# Patient Record
Sex: Male | Born: 1965 | Race: White | Hispanic: No | Marital: Married | Smoking: Never smoker
Health system: Southern US, Community
[De-identification: ages and names within clinical notes are randomized; demographics above are authoritative.]

## PROBLEM LIST (undated history)

## (undated) DIAGNOSIS — I519 Heart disease, unspecified: Secondary | ICD-10-CM

## (undated) DIAGNOSIS — I10 Essential (primary) hypertension: Secondary | ICD-10-CM

## (undated) HISTORY — PX: KNEE ARTHROSCOPY: SUR90

## (undated) HISTORY — DX: Essential (primary) hypertension: I10

## (undated) HISTORY — DX: Heart disease, unspecified: I51.9

---

## 2006-10-31 ENCOUNTER — Emergency Department: Payer: Self-pay | Admitting: Emergency Medicine

## 2014-07-10 ENCOUNTER — Ambulatory Visit: Payer: Self-pay | Admitting: Pain Medicine

## 2014-09-02 ENCOUNTER — Ambulatory Visit: Payer: Self-pay | Admitting: Pain Medicine

## 2014-09-30 ENCOUNTER — Ambulatory Visit: Payer: Self-pay | Admitting: Pain Medicine

## 2014-10-02 ENCOUNTER — Ambulatory Visit: Payer: Self-pay | Admitting: Pain Medicine

## 2014-10-15 ENCOUNTER — Ambulatory Visit: Payer: Self-pay | Admitting: Pain Medicine

## 2014-11-11 ENCOUNTER — Ambulatory Visit: Payer: Self-pay | Admitting: Pain Medicine

## 2014-11-26 ENCOUNTER — Ambulatory Visit: Payer: Self-pay | Admitting: Pain Medicine

## 2014-11-29 ENCOUNTER — Ambulatory Visit: Payer: Self-pay | Admitting: Family Medicine

## 2014-12-02 ENCOUNTER — Ambulatory Visit: Payer: Self-pay | Admitting: Family Medicine

## 2015-01-15 ENCOUNTER — Ambulatory Visit
Admit: 2015-01-15 | Disposition: A | Discharge status: Home / Self Care | Payer: Self-pay | Attending: Pain Medicine | Admitting: Pain Medicine

## 2015-01-22 ENCOUNTER — Ambulatory Visit: Payer: Self-pay | Admitting: Pain Medicine

## 2015-01-28 ENCOUNTER — Encounter: Payer: Self-pay | Admitting: Pain Medicine

## 2015-01-28 ENCOUNTER — Ambulatory Visit: Payer: BLUE CROSS/BLUE SHIELD | Attending: Pain Medicine | Admitting: Pain Medicine

## 2015-01-28 VITALS — BP 108/53 | HR 63 | Temp 97.5°F | Resp 16 | Ht 73.0 in | Wt 340.0 lb

## 2015-01-28 DIAGNOSIS — M179 Osteoarthritis of knee, unspecified: Secondary | ICD-10-CM | POA: Insufficient documentation

## 2015-01-28 DIAGNOSIS — M5136 Other intervertebral disc degeneration, lumbar region: Secondary | ICD-10-CM | POA: Insufficient documentation

## 2015-01-28 DIAGNOSIS — M4186 Other forms of scoliosis, lumbar region: Secondary | ICD-10-CM | POA: Insufficient documentation

## 2015-01-28 DIAGNOSIS — M1710 Unilateral primary osteoarthritis, unspecified knee: Secondary | ICD-10-CM | POA: Insufficient documentation

## 2015-01-28 DIAGNOSIS — M51369 Other intervertebral disc degeneration, lumbar region without mention of lumbar back pain or lower extremity pain: Secondary | ICD-10-CM | POA: Insufficient documentation

## 2015-01-28 DIAGNOSIS — M47816 Spondylosis without myelopathy or radiculopathy, lumbar region: Secondary | ICD-10-CM | POA: Diagnosis not present

## 2015-01-28 DIAGNOSIS — M545 Low back pain: Secondary | ICD-10-CM | POA: Diagnosis present

## 2015-01-28 DIAGNOSIS — M170 Bilateral primary osteoarthritis of knee: Secondary | ICD-10-CM

## 2015-01-28 MED ORDER — ORPHENADRINE CITRATE 30 MG/ML IJ SOLN
INTRAMUSCULAR | Status: AC
  Administered 2015-01-28: 60 mg
  Filled 2015-01-28: qty 2

## 2015-01-28 MED ORDER — TIZANIDINE HCL 4 MG PO CAPS: ORAL_CAPSULE | ORAL | Status: DC

## 2015-01-28 MED ORDER — LIDOCAINE HCL (PF) 1 % IJ SOLN
INTRAMUSCULAR | Status: AC
  Administered 2015-01-28: 09:00:00
  Filled 2015-01-28: qty 5

## 2015-01-28 MED ORDER — SODIUM CHLORIDE 0.9 % IJ SOLN
INTRAMUSCULAR | Status: AC
  Administered 2015-01-28: 09:00:00
  Filled 2015-01-28: qty 20

## 2015-01-28 MED ORDER — MIDAZOLAM HCL 5 MG/5ML IJ SOLN
INTRAMUSCULAR | Status: AC
  Administered 2015-01-28: 4 mg
  Filled 2015-01-28: qty 5

## 2015-01-28 MED ORDER — BUPIVACAINE HCL (PF) 0.25 % IJ SOLN
INTRAMUSCULAR | Status: AC
  Administered 2015-01-28: 09:00:00
  Filled 2015-01-28: qty 30

## 2015-01-28 MED ORDER — CEFAZOLIN SODIUM 1 G IJ SOLR
INTRAMUSCULAR | Status: AC
Start: 2015-01-28 — End: 2015-01-28
  Administered 2015-01-28: 1 g
  Filled 2015-01-28: qty 10

## 2015-01-28 MED ORDER — FENTANYL CITRATE (PF) 100 MCG/2ML IJ SOLN
INTRAMUSCULAR | Status: AC
  Administered 2015-01-28: 100 ug
  Filled 2015-01-28: qty 2

## 2015-01-28 MED ORDER — TRIAMCINOLONE ACETONIDE 40 MG/ML IJ SUSP
INTRAMUSCULAR | Status: AC
Start: 2015-01-28 — End: 2015-01-28
  Administered 2015-01-28: 09:00:00
  Filled 2015-01-28: qty 1

## 2015-01-28 NOTE — Patient Instructions (Addendum)
Continue present medications and antibioticcs. Patient reminded of respiratory depression confusion of the side effects of Zanaflex. Patient's wife also informed of the depression confusion and side effects of Zanaflex. Both understanding and will make all attempts to avoid undesirable side effects  F/U PCP for evaliation of  BP and general medical  Condition.  F/U surgical evaluation.  F/U nrurological evaluation.  May consider radiofrequency rhizolysis or intraspinal procedures pending response to present treatment and F/U evaluation.  Patient to call [facility] should patient have concerns prior to scheduled return appointment.     Pain Management Discharge Instructions  General Discharge Instructions :  If you need to reach your doctor call: Monday-Friday 8:00 am - 4:00 pm at [phone] or toll free [phone].  After clinic hours [phone] to have operator reach doctor.  Bring all of your medication bottles to all your appointments in the pain clinic.  To cancel or reschedule your appointment with Pain Management please remember to call 24 hours in advance to avoid a fee.  Refer to the educational materials which you have been given on: General Risks, I had my Procedure. Discharge Instructions, Post Sedation.  Post Procedure Instructions:  The drugs you were given will stay in your system until tomorrow, so for the next 24 hours you should not drive, make any legal decisions or drink any alcoholic beverages.  You may eat anything you prefer, but it is better to start with liquids then soups and crackers, and gradually work up to solid foods.  Please notify your doctor immediately if you have any unusual bleeding, trouble breathing or pain that is not related to your normal pain.  Depending on the type of procedure that was done, some parts of your body may feel week and/or numb.  This usually clears up by tonight or the next day.  Walk with the use of an  assistive device or accompanied by an adult for the 24 hours.  You may use ice on the affected area for the first 24 hours.  Put ice in a Ziploc bag and cover with a towel and place against area 15 minutes on 15 minutes off.  You may switch to heat after 24 hours.

## 2015-01-28 NOTE — Progress Notes (Signed)
PROCEDURE PERFORMED: Lumbar epidural steroid injection   NOTE: The patient is a 49 y.o. male who returns to [facility] for further evaluation and treatment of pain involving the lumbar and lower extremity region. MRI revealed the patient to be with degenerative changes of the lumbar spine with loss of height of L2-3 and L3-4, moderate loss of height at L4-5, loss of disc height at L5-S1, facet degenerative changes noted throughout the lumbar spine, evidence of L4-5 and L5-S1 level scoliosis with apex at L2-3. There is concern regarding intraspinal abnormalities contributing to patient's symptoms. We will proceed with lumbar epidural steroid injection in attempt to decrease severity of symptoms and minimize severity of patient's pain and hopefully retard progression of patient's symptoms. The risks, benefits, and expectations of the procedure have been discussed and explained to the patient who was understanding and in agreement with suggested treatment plan. We will proceed with interventional treatment as discussed and explained to the patient who is willing to proceed with procedure as planned.   DESCRIPTION OF PROCEDURE: Lumbar epidural steroid injection with IV Versed, IV fentanyl conscious sedation, EKG, blood pressure, pulse, and pulse oximetry monitoring. The procedure was performed with the patient in the prone position under fluoroscopic guidance. A local anesthetic skin wheal of 1.5% plain lidocaine was accomplished at proposed entry site. An 18-gauge Tuohy epidural needle was inserted at the L 3 vertebral body level left of the midline via loss-of-resistance technique with negative heme and negative CSF return. A total of 4 mL of Preservative-Free normal saline with 40 mg of Kenalog injected incrementally via epidurally placed needle. Needle removed. The patient tolerated the injection well.   PLAN:   1. Medications: We will continue presently prescribed medications. 2. Will consider  modification of treatment regimen pending response to treatment rendered on today's visit and follow-up evaluation. 3. The patient is to follow-up with primary care physician regarding blood pressure and general medical condition status post lumbar epidural steroid injection performed on today's visit. 4. Surgical evaluation. 5. Neurological evaluation. 6. The patient may be a candidate for radiofrequency procedures, implantation device, and other treatment pending response to treatment and follow-up evaluation. 7. The patient has been advised to adhere to proper body mechanics and avoid activities which appear to aggravate condition. 8. The patient has been advised to call the Pain Management Center prior to scheduled return appointment should there be significant change in condition or should there be significant  1. Medications: We will continue presently prescribed medications.  2. Will consider modification of treatment regimen pending response to treatment rendered on today's visit and follow-up evaluation.  3. The patient is to follow-up with primary care physician regarding blood pressure and general medical condition status post lumbar epidural steroid injection performed on today's visit.  4. Surgical evaluation.  5. Neurological evaluation. 6. The patient may be a candidate for radiofrequency procedures, implantation device, and other treatment pending response to treatment and follow-up evaluation.  7. The patient has been advised to adhere to proper body mechanics and avoid activities which appear to aggravate condition.  8. The patient has been advised to call the Pain Management Center prior to scheduled return appointment should there be significant change in condition or should should patient have other concerns regarding condition prior to scheduled return appointment.  The patient is understanding and in agreement with suggested treatment plan.

## 2015-01-29 ENCOUNTER — Telehealth: Payer: Self-pay | Admitting: *Deleted

## 2015-01-29 NOTE — Telephone Encounter (Signed)
Follow up call complete

## 2015-02-02 NOTE — Telephone Encounter (Signed)
No problems post procedure. 

## 2015-02-12 ENCOUNTER — Ambulatory Visit: Payer: BLUE CROSS/BLUE SHIELD | Attending: Pain Medicine | Admitting: Pain Medicine

## 2015-02-12 ENCOUNTER — Encounter: Payer: Self-pay | Admitting: Pain Medicine

## 2015-02-12 VITALS — BP 115/50 | HR 70 | Temp 97.6°F | Resp 16 | Ht 73.0 in | Wt 350.0 lb

## 2015-02-12 DIAGNOSIS — M533 Sacrococcygeal disorders, not elsewhere classified: Secondary | ICD-10-CM | POA: Diagnosis not present

## 2015-02-12 DIAGNOSIS — M79605 Pain in left leg: Secondary | ICD-10-CM | POA: Diagnosis present

## 2015-02-12 DIAGNOSIS — M545 Low back pain: Secondary | ICD-10-CM | POA: Diagnosis present

## 2015-02-12 DIAGNOSIS — M5416 Radiculopathy, lumbar region: Secondary | ICD-10-CM

## 2015-02-12 DIAGNOSIS — M5136 Other intervertebral disc degeneration, lumbar region: Secondary | ICD-10-CM | POA: Insufficient documentation

## 2015-02-12 DIAGNOSIS — M419 Scoliosis, unspecified: Secondary | ICD-10-CM | POA: Insufficient documentation

## 2015-02-12 DIAGNOSIS — M179 Osteoarthritis of knee, unspecified: Secondary | ICD-10-CM | POA: Diagnosis not present

## 2015-02-12 DIAGNOSIS — M47816 Spondylosis without myelopathy or radiculopathy, lumbar region: Secondary | ICD-10-CM | POA: Diagnosis not present

## 2015-02-12 DIAGNOSIS — M170 Bilateral primary osteoarthritis of knee: Secondary | ICD-10-CM

## 2015-02-12 DIAGNOSIS — M79604 Pain in right leg: Secondary | ICD-10-CM | POA: Diagnosis present

## 2015-02-12 MED ORDER — TRAMADOL HCL 50 MG PO TABS: ORAL_TABLET | ORAL | Status: DC

## 2015-02-12 NOTE — Patient Instructions (Addendum)
Continue present medications. Begin tramadol and stop Vicodin if you take Aleve limit to 1 Aleve twice per day and do not take ibuprofen or similar medications   Lumbar epidural steroid injection 02/23/2015  F/U PCP for evaliation of  BP and general medical  condition.  F/U surgical evaluation.  F/U neurological evaluation.  May consider radiofrequency rhizolysis or intraspinal procedures pending response to present treatment and F/U evaluation.  Patient to call Pain Management Center should patient have concerns prior to scheduled return appointment. Epidural Steroid Injection Patient Information  Description: The epidural space surrounds the nerves as they exit the spinal cord.  In some patients, the nerves can be compressed and inflamed by a bulging disc or a tight spinal canal (spinal stenosis).  By injecting steroids into the epidural space, we can bring irritated nerves into direct contact with a potentially helpful medication.  These steroids act directly on the irritated nerves and can reduce swelling and inflammation which often leads to decreased pain.  Epidural steroids may be injected anywhere along the spine and from the neck to the low back depending upon the location of your pain.   After numbing the skin with local anesthetic (like Novocaine), a small needle is passed into the epidural space slowly.  You may experience a sensation of pressure while this is being done.  The entire block usually last less than 10 minutes.  Conditions which may be treated by epidural steroids:   Low back and leg pain  Neck and arm pain  Spinal stenosis  Post-laminectomy syndrome  Herpes zoster (shingles) pain  Pain from compression fractures  Preparation for the injection:  1. Do not eat any solid food or dairy products within 6 hours of your appointment.  2. You may drink clear liquids up to 2 hours before appointment.  Clear liquids include water, black coffee, juice or soda.  No  milk or cream please. 3. You may take your regular medication, including pain medications, with a sip of water before your appointment  Diabetics should hold regular insulin (if taken separately) and take 1/2 normal NPH dos the morning of the procedure.  Carry some sugar containing items with you to your appointment. 4. A driver must accompany you and be prepared to drive you home after your procedure.  5. Bring all your current medications with your. 6. An IV may be inserted and sedation may be given at the discretion of the physician.   7. A blood pressure cuff, EKG and other monitors will often be applied during the procedure.  Some patients may need to have extra oxygen administered for a short period. 8. You will be asked to provide medical information, including your allergies, prior to the procedure.  We must know immediately if you are taking blood thinners (like Coumadin/Warfarin)  Or if you are allergic to IV iodine contrast (dye). We must know if you could possible be pregnant.  Possible side-effects:  Bleeding from needle site  Infection (rare, may require surgery)  Nerve injury (rare)  Numbness & tingling (temporary)  Difficulty urinating (rare, temporary)  Spinal headache ( a headache worse with upright posture)  Light -headedness (temporary)  Pain at injection site (several days)  Decreased blood pressure (temporary)  Weakness in arm/leg (temporary)  Pressure sensation in back/neck (temporary)  Call if you experience:  Fever/chills associated with headache or increased back/neck pain.  Headache worsened by an upright position.  New onset weakness or numbness of an extremity below the injection site  Hives or difficulty breathing (go to the emergency room)  Inflammation or drainage at the infection site  Severe back/neck pain  Any new symptoms which are concerning to you  Please note:  Although the local anesthetic injected can often make your back or  neck feel good for several hours after the injection, the pain will likely return.  It takes 3-7 days for steroids to work in the epidural space.  You may not notice any pain relief for at least that one week.  If effective, we will often do a series of three injections spaced 3-6 weeks apart to maximally decrease your pain.  After the initial series, we generally will wait several months before considering a repeat injection of the same type.  If you have any questions, please call [phone] [facility] Clinic[facility] R[facility] AND COMPLICATIONS  What are the risk, side effects and possible complications? Generally speaking, most procedures are safe.  However, with any procedure there are risks, side effects, and the possibility of complications.  The risks and complications are dependent upon the sites that are lesioned, or the type of nerve block to be performed.  The closer the procedure is to the spine, the more serious the risks are.  Great care is taken when placing the radio frequency needles, block needles or lesioning probes, but sometimes complications can occur. 1. Infection: Any time there is an injection through the skin, there is a risk of infection.  This is why sterile conditions are used for these blocks.  There are four possible types of infection. 1. Localized skin infection. 2. Central Nervous System Infection-This can be in the form of Meningitis, which can be deadly. 3. Epidural Infections-This can be in the form of an epidural abscess, which can cause pressure inside of the spine, causing compression of the spinal cord with subsequent paralysis. This would require an emergency surgery to decompress, and there are no guarantees that the patient would recover from the paralysis. 4. Discitis-This is an infection of the intervertebral discs.  It occurs in about 1% of discography procedures.  It is difficult to treat and it may lead to surgery.         2. Pain: the needles have to go through skin and soft tissues, will cause soreness.       3. Damage to internal structures:  The nerves to be lesioned may be near blood vessels or    other nerves which can be potentially damaged.       4. Bleeding: Bleeding is more common if the patient is taking blood thinners such as  aspirin, Coumadin, Ticiid, Plavix, etc., or if he/she have some genetic predisposition  such as hemophilia. Bleeding into the spinal canal can cause compression of the spinal  cord with subsequent paralysis.  This would require an emergency surgery to  decompress and there are no guarantees that the patient would recover from the  paralysis.       5. Pneumothorax:  Puncturing of a lung is a possibility, every time a needle is introduced in  the area of the chest or upper back.  Pneumothorax refers to free air around the  collapsed lung(s), inside of the thoracic cavity (chest cavity).  Another two possible  complications related to a similar event would include: Hemothorax and Chylothorax.   These are variations of the Pneumothorax, where instead of air around the collapsed  lung(s), you may have blood or chyle, respectively.  6. Spinal headaches: They may occur with any procedures in the area of the spine.       7. Persistent CSF (Cerebro-Spinal Fluid) leakage: This is a rare problem, but may occur  with prolonged intrathecal or epidural catheters either due to the formation of a fistulous  track or a dural tear.       8. Nerve damage: By working so close to the spinal cord, there is always a possibility of  nerve damage, which could be as serious as a permanent spinal cord injury with  paralysis.       9. Death:  Although rare, severe deadly allergic reactions known as "Anaphylactic  reaction" can occur to any of the medications used.      10. Worsening of the symptoms:  We can always make thing worse.  What are the chances of something like this happening? Chances of any of this  occuring are extremely low.  By statistics, you have more of a chance of getting killed in a motor vehicle accident: while driving to the hospital than any of the above occurring .  Nevertheless, you should be aware that they are possibilities.  In general, it is similar to taking a shower.  Everybody knows that you can slip, hit your head and get killed.  Does that mean that you should not shower again?  Nevertheless always keep in mind that statistics do not mean anything if you happen to be on the wrong side of them.  Even if a procedure has a 1 (one) in a 1,000,000 (million) chance of going wrong, it you happen to be that one..Also, keep in mind that by statistics, you have more of a chance of having something go wrong when taking medications.  Who should not have this procedure? If you are on a blood thinning medication (e.g. Coumadin, Plavix, see list of "Blood Thinners"), or if you have an active infection going on, you should not have the procedure.  If you are taking any blood thinners, please inform your physician.  How should I prepare for this procedure?  Do not eat or drink anything at least six hours prior to the procedure.  Bring a driver with you .  It cannot be a taxi.  Come accompanied by an adult that can drive you back, and that is strong enough to help you if your legs get weak or numb from the local anesthetic.  Take all of your medicines the morning of the procedure with just enough water to swallow them.  If you have diabetes, make sure that you are scheduled to have your procedure done first thing in the morning, whenever possible.  If you have diabetes, take only half of your insulin dose and notify our nurse that you have done so as soon as you arrive at the clinic.  If you are diabetic, but only take blood sugar pills (oral hypoglycemic), then do not take them on the morning of your procedure.  You may take them after you have had the procedure.  Do not take aspirin  or any aspirin-containing medications, at least eleven (11) days prior to the procedure.  They may prolong bleeding.  Wear loose fitting clothing that may be easy to take off and that you would not mind if it got stained with Betadine or blood.  Do not wear any jewelry or perfume  Remove any nail coloring.  It will interfere with some of our monitoring equipment.  NOTE: Remember that this is  not meant to be interpreted as a complete list of all possible complications.  Unforeseen problems may occur.  BLOOD THINNERS The following drugs contain aspirin or other products, which can cause increased bleeding during surgery and should not be taken for 2 weeks prior to and 1 week after surgery.  If you should need take something for relief of minor pain, you may take acetaminophen which is found in Tylenol,m Datril, Anacin-3 and Panadol. It is not blood thinner. The products listed below are.  Do not take any of the products listed below in addition to any listed on your instruction sheet.  A.P.C or A.P.C with Codeine Codeine Phosphate Capsules #3 Ibuprofen Ridaura  ABC compound Congesprin Imuran rimadil  Advil Cope Indocin Robaxisal  Alka-Seltzer Effervescent Pain Reliever and Antacid Coricidin or Coricidin-D  Indomethacin Rufen  Alka-Seltzer plus Cold Medicine Cosprin Ketoprofen S-A-C Tablets  Anacin Analgesic Tablets or Capsules Coumadin Korlgesic Salflex  Anacin Extra Strength Analgesic tablets or capsules CP-2 Tablets Lanoril Salicylate  Anaprox Cuprimine Capsules Levenox Salocol  Anexsia-D Dalteparin Magan Salsalate  Anodynos Darvon compound Magnesium Salicylate Sine-off  Ansaid Dasin Capsules Magsal Sodium Salicylate  Anturane Depen Capsules Marnal Soma  APF Arthritis pain formula Dewitt's Pills Measurin Stanback  Argesic Dia-Gesic Meclofenamic Sulfinpyrazone  Arthritis Bayer Timed Release Aspirin Diclofenac Meclomen Sulindac  Arthritis pain formula Anacin Dicumarol Medipren Supac   Analgesic (Safety coated) Arthralgen Diffunasal Mefanamic Suprofen  Arthritis Strength Bufferin Dihydrocodeine Mepro Compound Suprol  Arthropan liquid Dopirydamole Methcarbomol with Aspirin Synalgos  ASA tablets/Enseals Disalcid Micrainin Tagament  Ascriptin Doan's Midol Talwin  Ascriptin A/D Dolene Mobidin Tanderil  Ascriptin Extra Strength Dolobid Moblgesic Ticlid  Ascriptin with Codeine Doloprin or Doloprin with Codeine Momentum Tolectin  Asperbuf Duoprin Mono-gesic Trendar  Aspergum Duradyne Motrin or Motrin IB Triminicin  Aspirin plain, buffered or enteric coated Durasal Myochrisine Trigesic  Aspirin Suppositories Easprin Nalfon Trillsate  Aspirin with Codeine Ecotrin Regular or Extra Strength Naprosyn Uracel  Atromid-S Efficin Naproxen Ursinus  Auranofin Capsules Elmiron Neocylate Vanquish  Axotal Emagrin Norgesic Verin  Azathioprine Empirin or Empirin with Codeine Normiflo Vitamin E  Azolid Emprazil Nuprin Voltaren  Bayer Aspirin plain, buffered or children's or timed BC Tablets or powders Encaprin Orgaran Warfarin Sodium  Buff-a-Comp Enoxaparin Orudis Zorpin  Buff-a-Comp with Codeine Equegesic Os-Cal-Gesic   Buffaprin Excedrin plain, buffered or Extra Strength Oxalid   Bufferin Arthritis Strength Feldene Oxphenbutazone   Bufferin plain or Extra Strength Feldene Capsules Oxycodone with Aspirin   Bufferin with Codeine Fenoprofen Fenoprofen Pabalate or Pabalate-SF   Buffets II Flogesic Panagesic   Buffinol plain or Extra Strength Florinal or Florinal with Codeine Panwarfarin   Buf-Tabs Flurbiprofen Penicillamine   Butalbital Compound Four-way cold tablets Penicillin   Butazolidin Fragmin Pepto-Bismol   Carbenicillin Geminisyn Percodan   Carna Arthritis Reliever Geopen Persantine   Carprofen Gold's salt Persistin   Chloramphenicol Goody's Phenylbutazone   Chloromycetin Haltrain Piroxlcam   Clmetidine heparin Plaquenil   Cllnoril Hyco-pap Ponstel   Clofibrate Hydroxy  chloroquine Propoxyphen         Before stopping any of these medications, be sure to consult the physician who ordered them.  Some, such as Coumadin (Warfarin) are ordered to prevent or treat serious conditions such as "deep thrombosis", "pumonary embolisms", and other heart problems.  The amount of time that you may need off of the medication may also vary with the medication and the reason for which you were taking it.  If you are taking any of these medications, please make sure  you notify your pain physician before you undergo any procedures.

## 2015-02-12 NOTE — Progress Notes (Signed)
Discharged to home ambulatory at 0813 with script for tramadol.  Pre procedure instructions given with teach back 3 done.

## 2015-02-12 NOTE — Progress Notes (Signed)
   Subjective:    Patient ID: Sean Trujillo, male    DOB: 11/08/1965, 49 y.o.   MRN: [id number]  HPI  Patient is 49 year old gentleman who [facility] for [facility] evaluation and treatment of pain involving the lower back and lower extremity region predominantly with pain aggravated by twisting and turning maneuvers. Patient states that the pain becomes more intense as the day progresses. We discussed patient's condition and will schedule patient for neurosurgical evaluation and will proceed with lumbar epidural steroid injection at time of return appointment. Patient also wishes to replace the Vicodin with tramadol which we will do on today's visit as well and patient may take Aleve 1 pill per day or 1 pill twice a day if tolerated without undesirable side effects. The patient was understanding and in agreement with plan.      Review of Systems     Objective:   Physical Exam  There was tenderness over the splenius capitis and occipitalis muscles of mild degree. Palpation of the acromioclavicular and glenohumeral joint regions reproduce mild discomfort with patient having bilaterally equal grip strength Tinel's and Phalen's maneuver unremarkable and unremarkable Spurling's maneuver. Patient of the thoracic region was without crepitus of the thoracic region without excessive tends to palpation of the spinous processes. Palpation over the lumbar paraspinal musculature region was a tends to palpation of moderately severe degree with lateral bending and rotation and extension and palpation of the lumbar facets reproducing moderately severe discomfort. Straight leg raising was tolerates approximately 20 without increased pain with dorsiflexion noted. There was no definite sensory deficit of dermatomal distribution was detected as well as native clonus and negative Homans. Mild tenderness of the PSIS and PII S region was noted. Mild tenderness to palpation of the knee with  crepitus of the knee with no increased warmth or erythema noted and with negative anterior and posterior drawer signs. Abdomen nontender and no costovertebral angle tenderness noted examination the knee revealed tenderness to palpation of the knee with negative anterior and posterior drawer signs without ballottement of the patella and no increased warmth erythema of the knee was noted. Abdomen nontender and no costovertebral angle tenderness noted.      Assessment & Plan:   Degenerative disc disease lumbar spine MRI revealed loss of height of the disc at L2-3 and L3-4 with moderate loss of disc height at L4-5 and mild loss of disc height at L5-S1, with facet degenerative changes noted throughout the lumbar spine L4-5 and L5-S1 especially with  slight scoliosis at the apex of L2-3  Lumbar facet syndrome  Lumbar radiculopathy  Degenerative joint disease of knee  Sacroiliac joint dysfunction   Plan  Continue present medications. As discussed we will replace the Vicodin with tramadol and you may take one Aleve per day or twice per day if tolerated  F/U PCP for evaliation of  BP and general medical  condition.  F/U surgical evaluation. Neurosurgical evaluation scheduled to evaluate patient's lumbar and lower extremity pain paresthesias  F/U neurological evaluation.  May consider radiofrequency rhizolysis or intraspinal procedures pending response to present treatment and F/U evaluation.  Patient to call [facility] should patient have concerns prior to scheduled return appointment.

## 2015-02-20 ENCOUNTER — Other Ambulatory Visit: Payer: Self-pay | Admitting: Pain Medicine

## 2015-02-20 DIAGNOSIS — M47816 Spondylosis without myelopathy or radiculopathy, lumbar region: Secondary | ICD-10-CM

## 2015-02-23 ENCOUNTER — Encounter: Payer: Self-pay | Admitting: Pain Medicine

## 2015-02-23 ENCOUNTER — Ambulatory Visit: Payer: BLUE CROSS/BLUE SHIELD | Attending: Pain Medicine | Admitting: Pain Medicine

## 2015-02-23 VITALS — BP 115/51 | HR 68 | Temp 97.6°F | Resp 18 | Ht 74.0 in | Wt 345.0 lb

## 2015-02-23 DIAGNOSIS — M5136 Other intervertebral disc degeneration, lumbar region: Secondary | ICD-10-CM

## 2015-02-23 DIAGNOSIS — M533 Sacrococcygeal disorders, not elsewhere classified: Secondary | ICD-10-CM

## 2015-02-23 DIAGNOSIS — M170 Bilateral primary osteoarthritis of knee: Secondary | ICD-10-CM

## 2015-02-23 DIAGNOSIS — M48062 Spinal stenosis, lumbar region with neurogenic claudication: Secondary | ICD-10-CM | POA: Insufficient documentation

## 2015-02-23 DIAGNOSIS — M51369 Other intervertebral disc degeneration, lumbar region without mention of lumbar back pain or lower extremity pain: Secondary | ICD-10-CM

## 2015-02-23 DIAGNOSIS — M47816 Spondylosis without myelopathy or radiculopathy, lumbar region: Secondary | ICD-10-CM | POA: Diagnosis not present

## 2015-02-23 DIAGNOSIS — M419 Scoliosis, unspecified: Secondary | ICD-10-CM | POA: Diagnosis not present

## 2015-02-23 DIAGNOSIS — M545 Low back pain: Secondary | ICD-10-CM | POA: Diagnosis present

## 2015-02-23 MED ORDER — CIPROFLOXACIN HCL 250 MG PO TABS: 250.0000 mg | ORAL_TABLET | Freq: Two times a day (BID) | ORAL | Status: DC

## 2015-02-23 MED ORDER — CIPROFLOXACIN IN D5W 400 MG/200ML IV SOLN
INTRAVENOUS | Status: AC
  Administered 2015-02-23: 12:00:00 via EPIDURAL
  Filled 2015-02-23: qty 200

## 2015-02-23 MED ORDER — LIDOCAINE HCL (PF) 1 % IJ SOLN
INTRAMUSCULAR | Status: AC
  Administered 2015-02-23: 12:00:00
  Filled 2015-02-23: qty 5

## 2015-02-23 NOTE — Patient Instructions (Addendum)
Continue present medications and antibiotics  F/U PCP for evaliation of  BP and general medical  condition.  F/U surgical evaluation.  F/U neurological evaluation.  May consider radiofrequency rhizolysis or intraspinal procedures pending response to present treatment and F/U evaluation.  Patient to call [facility] should patient have concerns prior to scheduled return appointment. Pain Management Discharge Instructions  General Discharge Instructions :  If you need to reach your doctor call: Monday-Friday 8:00 am - 4:00 pm at [phone] or toll free [phone].  After clinic hours [phone] to have operator reach doctor.  Bring all of your medication bottles to all your appointments in the pain clinic.  To cancel or reschedule your appointment with Pain Management please remember to call 24 hours in advance to avoid a fee.  Refer to the educational materials which you have been given on: General Risks, I had my Procedure. Discharge Instructions, Post Sedation.  Post Procedure Instructions:  The drugs you were given will stay in your system until tomorrow, so for the next 24 hours you should not drive, make any legal decisions or drink any alcoholic beverages.  You may eat anything you prefer, but it is better to start with liquids then soups and crackers, and gradually work up to solid foods.  Please notify your doctor immediately if you have any unusual bleeding, trouble breathing or pain that is not related to your normal pain.  Depending on the type of procedure that was done, some parts of your body may feel week and/or numb.  This usually clears up by tonight or the next day.  Walk with the use of an assistive device or accompanied by an adult for the 24 hours.  You may use ice on the affected area for the first 24 hours.  Put ice in a Ziploc bag and cover with a towel and place against area 15 minutes on 15 minutes off.  You may switch to heat after 24  hours. 

## 2015-02-23 NOTE — Progress Notes (Signed)
   Subjective:    Patient ID: Sean Trujillo, male    DOB: 06/17/1966, 49 y.o.   MRN: [id number]  HPI  PROCEDURE PERFORMED: Lumbar epidural steroid injection   NOTE: The patient is a 49 y.o. male who returns to [facility] for further evaluation and treatment of pain involving the lumbar and lower extremity region. MRI revealed the patient to be with generative changes with loss of the disc height L2-3 and L3-4, moderate loss of disc height at L4-5, mild loss of disc height L5-S1, facet degenerative changes noted throughout the lumbar spine L4-L5 and L5-S1 especially, slight scoliosis at the apex of L2-3.Marland Kitchen. The risks, benefits, and expectations of the procedure have been discussed and explained to the patient who was understanding and in agreement with suggested treatment plan. We will proceed with interventional treatment as discussed and explained to the patient who is willing to proceed with procedure as planned.   DESCRIPTION OF PROCEDURE: Lumbar epidural steroid injection with IV Versed, IV fentanyl conscious sedation, EKG, blood pressure, pulse, and pulse oximetry monitoring. The procedure was performed with the patient in the prone position under fluoroscopic guidance. A local anesthetic skin wheal of 1.5% plain lidocaine was accomplished at proposed entry site. An 18-gauge Tuohy epidural needle was inserted at the L 5 vertebral body level left of the midline via loss-of-resistance technique with negative heme and negative CSF return. A total of 40 mL of Preservative-Free normal saline with 40 mg of Kenalog injected incrementally via epidurally placed needle. Needle removed. The patient tolerated the injection well.   PLAN:   1. Medications: We will continue presently prescribed medications. 2. Will consider modification of treatment regimen pending response to treatment rendered on today's visit and follow-up evaluation. 3. The patient is to follow-up with primary care  physician regarding blood pressure and general medical condition status post lumbar epidural steroid injection performed on today's visit. 4. Surgical evaluation. 5. Neurological evaluation. 6. The patient may be a candidate for radiofrequency procedures, implantation device, and other treatment pending response to treatment and follow-up evaluation. 7. The patient has been advised to adhere to proper body mechanics and avoid activities which appear to aggravate condition. 8. The patient has been advised to call the [facility] prior to scheduled return appointment should there be significant change in condition or should there be significant  1. Medications: We will continue presently prescribed medications.  2. Will consider modification of treatment regimen pending response to treatment rendered on today's visit and follow-up evaluation.  3. The patient is to follow-up with primary care physician regarding blood pressure and general medical condition status post lumbar epidural steroid injection performed on today's visit.  4. Surgical evaluation.  5. Neurological evaluation. 6. The patient may be a candidate for radiofrequency procedures, implantation device, and other treatment pending response to treatment and follow-up evaluation.  7. The patient has been advised to adhere to proper body mechanics and avoid activities which appear to aggravate condition.  8. The patient has been advised to call the [facility] prior to scheduled return appointment should there be significant change in condition or should should patient have other concerns regarding condition prior to scheduled return appointment.  The patient is understanding and in agreement with suggested treatment plan.   Review of Systems     Objective:   Physical Exam        Assessment & Plan:

## 2015-02-23 NOTE — Progress Notes (Signed)
Safety precautions to be maintained throughout the outpatient stay will include: orient to surroundings, keep bed in low position, maintain call bell within reach at all times, provide assistance with transfer out of bed and ambulation.  

## 2015-02-23 NOTE — Progress Notes (Signed)
   Subjective:    Patient ID: Sean Trujillo, male    DOB: 09/16/1966, 49 y.o.   MRN: [id number]  HPI    Review of Systems     Objective:   Physical Exam        Assessment & Plan:

## 2015-02-24 ENCOUNTER — Telehealth: Payer: Self-pay | Admitting: *Deleted

## 2015-02-24 NOTE — Telephone Encounter (Signed)
F/u call done 

## 2015-03-05 ENCOUNTER — Other Ambulatory Visit: Payer: Self-pay | Admitting: Pain Medicine

## 2015-03-17 ENCOUNTER — Ambulatory Visit: Payer: BLUE CROSS/BLUE SHIELD | Attending: Pain Medicine | Admitting: Pain Medicine

## 2015-03-17 ENCOUNTER — Encounter: Payer: Self-pay | Admitting: Pain Medicine

## 2015-03-17 VITALS — BP 146/88 | HR 77 | Temp 97.7°F | Resp 16 | Ht 73.0 in | Wt 365.0 lb

## 2015-03-17 DIAGNOSIS — M79605 Pain in left leg: Secondary | ICD-10-CM | POA: Diagnosis present

## 2015-03-17 DIAGNOSIS — M545 Low back pain: Secondary | ICD-10-CM | POA: Diagnosis present

## 2015-03-17 DIAGNOSIS — M4806 Spinal stenosis, lumbar region: Secondary | ICD-10-CM | POA: Insufficient documentation

## 2015-03-17 DIAGNOSIS — M179 Osteoarthritis of knee, unspecified: Secondary | ICD-10-CM | POA: Diagnosis not present

## 2015-03-17 DIAGNOSIS — M533 Sacrococcygeal disorders, not elsewhere classified: Secondary | ICD-10-CM

## 2015-03-17 DIAGNOSIS — M51369 Other intervertebral disc degeneration, lumbar region without mention of lumbar back pain or lower extremity pain: Secondary | ICD-10-CM

## 2015-03-17 DIAGNOSIS — M170 Bilateral primary osteoarthritis of knee: Secondary | ICD-10-CM

## 2015-03-17 DIAGNOSIS — M5136 Other intervertebral disc degeneration, lumbar region: Secondary | ICD-10-CM | POA: Insufficient documentation

## 2015-03-17 DIAGNOSIS — M48062 Spinal stenosis, lumbar region with neurogenic claudication: Secondary | ICD-10-CM

## 2015-03-17 DIAGNOSIS — M4186 Other forms of scoliosis, lumbar region: Secondary | ICD-10-CM | POA: Diagnosis not present

## 2015-03-17 DIAGNOSIS — M79604 Pain in right leg: Secondary | ICD-10-CM | POA: Diagnosis present

## 2015-03-17 DIAGNOSIS — M47816 Spondylosis without myelopathy or radiculopathy, lumbar region: Secondary | ICD-10-CM

## 2015-03-17 MED ORDER — TRAMADOL HCL 50 MG PO TABS: ORAL_TABLET | ORAL | Status: AC

## 2015-03-17 MED ORDER — TIZANIDINE HCL 4 MG PO CAPS: ORAL_CAPSULE | ORAL | Status: AC

## 2015-03-17 NOTE — Progress Notes (Signed)
   Subjective:    Patient ID: Sean Trujillo, male    DOB: 12/11/1965, 49 y.o.   MRN: [id number]  HPI  Patient is 49 year old gentleman who returns to [facility] for further evaluation and treatment of pain involving the lower back and lower extremity regions. Patient states that he has significant pain involving the knees as well. Patient notes improvement with previous procedure performed in [facility] in terms of lower back lower extremity pain. Patient with improvement following lumbar epidural steroid injection. We discussed patient's condition and will consider patient for interventional treatment of the knee at time return appointment in attempt to decrease severity of symptoms hopefully minimize progression of patient's symptoms and the patient was understanding and in agreement status treatment plan     Review of Systems     Objective:   Physical Exam  There was tenderness to palpation of the splenius capitis and occipitalis musculature regions. Palpation of these regions reproduced moderate discomfort. Palpation over the region of the acromioclavicular glenohumeral joint region was a tends to palpation of mild degree. There was mild tenderness of the cervical facet and thoracic facet and paraspinal musculature regions. Palpation over the lumbar paraspinal musculature region lumbar facet region was a tends palpation of moderate degree. Lateral bending and rotation associated with moderate discomfort. Straight leg raising was tolerates approximately 20 without increase of pain with dorsiflexion noted. Examination of the knee we will patient to be with negative anterior and posterior drawer signs. There was no ballottement of the patella. There was tenderness to palpation of the knee with negative anterior and posterior drawer signs with crepitus noted with range of motion of the knee. No new lesions of the knee were noted no significant increased joint laxity  was noted. There was severe tenderness to palpation of the knee with the left knee be in the most tender. The abdomen was nontender and no costovertebral angle tenderness was noted      Assessment & Plan:  DDD lumbar Loss of disc height mild degree L2-3 and L3-4, moderate loss of disc height at L4-5, mild also disc height L5-S1, facet degenerative changes noted throughout the lumbar spine L4-L5 and L5-S1 especially with scoliosis at the apex of L2-3  Lumbar facet syndrome  Lumbar stenosis with neurogenic claudication  Degenerative joint disease of the knee    Plan   Continue present medications Zanaflex and Ultram  Knee injection to be performed at time of return appointment  F/U PCP for evaliation of  BP and general medical  condition.  F/U surgical evaluation as discussed  F/U neurological evaluation   May consider radiofrequency rhizolysis or intraspinal procedures pending response to present treatment and F/U evaluation.  Patient to call [facility] should patient have concerns prior to scheduled return appointment.

## 2015-03-17 NOTE — Patient Instructions (Addendum)
Continue present medications Ultram and Zanaflex  Knee injection to be performed Wednesday, 04/01/2015  F/U PCP for evaliation of  BP and general medical  condition.  F/U surgical evaluation.  F/U neurological evaluation.  May consider radiofrequency rhizolysis or intraspinal procedures pending response to present treatment and F/U evaluation.  Patient to call [facility] should patient have concerns prior to scheduled return appointment.

## 2015-03-17 NOTE — Progress Notes (Signed)
Safety precautions to be maintained throughout the outpatient stay will include: orient to surroundings, keep bed in low position, maintain call bell within reach at all times, provide assistance with transfer out of bed and ambulation.  

## 2015-04-01 ENCOUNTER — Ambulatory Visit: Payer: BLUE CROSS/BLUE SHIELD | Attending: Pain Medicine | Admitting: Pain Medicine

## 2015-04-01 ENCOUNTER — Encounter: Payer: Self-pay | Admitting: Pain Medicine

## 2015-04-01 VITALS — BP 115/58 | HR 54 | Temp 96.8°F | Resp 18 | Ht 73.0 in | Wt 360.0 lb

## 2015-04-01 DIAGNOSIS — M170 Bilateral primary osteoarthritis of knee: Secondary | ICD-10-CM | POA: Diagnosis not present

## 2015-04-01 DIAGNOSIS — M48062 Spinal stenosis, lumbar region with neurogenic claudication: Secondary | ICD-10-CM

## 2015-04-01 DIAGNOSIS — M533 Sacrococcygeal disorders, not elsewhere classified: Secondary | ICD-10-CM

## 2015-04-01 DIAGNOSIS — M51369 Other intervertebral disc degeneration, lumbar region without mention of lumbar back pain or lower extremity pain: Secondary | ICD-10-CM

## 2015-04-01 DIAGNOSIS — M47816 Spondylosis without myelopathy or radiculopathy, lumbar region: Secondary | ICD-10-CM

## 2015-04-01 DIAGNOSIS — M25561 Pain in right knee: Secondary | ICD-10-CM | POA: Diagnosis present

## 2015-04-01 DIAGNOSIS — M5136 Other intervertebral disc degeneration, lumbar region: Secondary | ICD-10-CM

## 2015-04-01 DIAGNOSIS — M25562 Pain in left knee: Secondary | ICD-10-CM | POA: Diagnosis present

## 2015-04-01 MED ORDER — TRIAMCINOLONE ACETONIDE 40 MG/ML IJ SUSP
INTRAMUSCULAR | Status: AC
  Administered 2015-04-01: 40 mg
  Filled 2015-04-01: qty 1

## 2015-04-01 MED ORDER — SODIUM CHLORIDE 0.9 % IJ SOLN
INTRAMUSCULAR | Status: AC
  Administered 2015-04-01: 09:00:00
  Filled 2015-04-01: qty 10

## 2015-04-01 MED ORDER — CIPROFLOXACIN HCL 250 MG PO TABS: 250.0000 mg | ORAL_TABLET | Freq: Two times a day (BID) | ORAL | Status: DC

## 2015-04-01 NOTE — Patient Instructions (Addendum)
Continue present medications and antibiotics. Please obtain your antibiotic Cipro today and begin taking antibiotic today  F/U PCP for evaliation of  BP and general medical  condition.  F/U surgical evaluation as discussed  F/U neurological evaluation.  May consider radiofrequency rhizolysis or intraspinal procedures pending response to present treatment and F/U evaluation.  Patient to call [facility] should patient have concerns prior to scheduled return appointment.    Pain Management Discharge Instructions  General Discharge Instructions :  If you need to reach your doctor call: Monday-Friday 8:00 am - 4:00 pm at [phone] or toll free [phone].  After clinic hours [phone] to have operator reach doctor.  Bring all of your medication bottles to all your appointments in the pain clinic.  To cancel or reschedule your appointment with Pain Management please remember to call 24 hours in advance to avoid a fee.  Refer to the educational materials which you have been given on: General Risks, I had my Procedure. Discharge Instructions, Post Sedation.  Post Procedure Instructions:   Please notify your doctor immediately if you have any unusual bleeding, trouble breathing or pain that is not related to your normal pain.  Depending on the type of procedure that was done, some parts of your body may feel week and/or numb.  This usually clears up by tonight or the next day.  Walk with the use of an assistive device or accompanied by an adult for the 24 hours.  You may use ice on the affected area for the first 24 hours.  Put ice in a Ziploc bag and cover with a towel and place against area 15 minutes on 15 minutes off.  You may switch to heat after 24 hours.  A prescription for Cipro was sent to your pharmacy and should be available for pickup today.

## 2015-04-01 NOTE — Progress Notes (Signed)
   Subjective:    Patient ID: Sean Trujillo, male    DOB: 02/22/1966, 49 y.o.   MRN: [id number]  HPI                  PROCEDURE:    KNEE INJECTION    The patient is a 49  -year-old male who returns to [facility] for further evaluation and treatment of pain involving the region of the knees. Prior studies have revealed patient to be with radiographic evidence of degenerative changes of knees .  There is concern regarding intra-articular abnormalities of the knee contributing to patient's symptomatology. We will proceed with interventional treatment consisting of knee injection in attempt to decrease severity of patient's symptoms, minimize progression of patient's symptoms, and avoid the need for more involved treatment. The risks, benefits, and expectations of the procedure have been explained to the patient who is with understanding and with agreement to proceed with interventional treatment as planned.      DESCRIPTION OF PROCEDURE:  Left KNEE INJECTION   The patient is in the  sitting positio with head of bed at 45 angle and the knee in the flexed  Position.  EKG, blood pressure, pulse, and pulse oximetry monitoring all in place. Betadine prep of proposed entry site is accomplished.  Left KNEE  INJECTION (LATERAL APPROACH)  Following identification of landmarks for knee injection, a 22-gauge needle was inserted inferior and lateral to the patella.  A total of 2 cc of 0.25% bupivacaine with Depo-Medrol was injected for left knee injection lateral approach.  Left KNEE INJECTION  (MEDIAL APPROACH)  Following identification of landmarks for knee injection, a 22-gauge needle was inserted inferior and medial to the patella. A total of 2 cc of 0.25% bupivacaine with Depo-Medrol was injected for left knee injection medial approach.   The patient tolerated the procedure well   A total of 40 mg of Depo-Medrol was utilized for the procedure.    Plan  Continue present  medications and antibiotics. Please obtain your antibiotic Cipro today and begin taking antibiotic today  F/U PCP Dr. Marvis MoellerMiles for evaliation of  BP and general medical  condition.  F/U surgical evaluation as discussed  F/U neurological evaluation.  May consider radiofrequency rhizolysis or intraspinal procedures pending response to present treatment and F/U evaluation.  Patient to call [facility] should patient have concerns prior to scheduled return appointment.        Review of Systems     Objective:   Physical Exam        Assessment & Plan:

## 2015-04-01 NOTE — Progress Notes (Signed)
Safety precautions to be maintained throughout the outpatient stay will include: orient to surroundings, keep bed in low position, maintain call bell within reach at all times, provide assistance with transfer out of bed and ambulation.  

## 2015-04-02 ENCOUNTER — Telehealth: Payer: Self-pay | Admitting: *Deleted

## 2015-04-02 NOTE — Telephone Encounter (Signed)
Left voice mail

## 2015-04-14 ENCOUNTER — Encounter: Payer: BLUE CROSS/BLUE SHIELD | Admitting: Pain Medicine

## 2016-10-29 ENCOUNTER — Emergency Department
Admission: EM | Admit: 2016-10-29 | Discharge: 2016-10-29 | Disposition: A | Discharge status: Home / Self Care | Payer: BLUE CROSS/BLUE SHIELD | Attending: Emergency Medicine | Admitting: Emergency Medicine

## 2016-10-29 ENCOUNTER — Emergency Department: Payer: BLUE CROSS/BLUE SHIELD

## 2016-10-29 DIAGNOSIS — R079 Chest pain, unspecified: Secondary | ICD-10-CM

## 2016-10-29 DIAGNOSIS — Z79899 Other long term (current) drug therapy: Secondary | ICD-10-CM | POA: Insufficient documentation

## 2016-10-29 DIAGNOSIS — R0789 Other chest pain: Secondary | ICD-10-CM | POA: Diagnosis present

## 2016-10-29 DIAGNOSIS — I10 Essential (primary) hypertension: Secondary | ICD-10-CM | POA: Insufficient documentation

## 2016-10-29 LAB — CBC
HEMATOCRIT: 41.4 % (ref 40.0–52.0)
Hemoglobin: 14.5 g/dL (ref 13.0–18.0)
MCH: 29.8 pg (ref 26.0–34.0)
MCHC: 34.9 g/dL (ref 32.0–36.0)
MCV: 85.3 fL (ref 80.0–100.0)
Platelets: 199 10*3/uL (ref 150–440)
RBC: 4.85 MIL/uL (ref 4.40–5.90)
RDW: 15.4 % — AB (ref 11.5–14.5)
WBC: 6.5 10*3/uL (ref 3.8–10.6)

## 2016-10-29 LAB — BASIC METABOLIC PANEL
Anion gap: 8 (ref 5–15)
BUN: 19 mg/dL (ref 6–20)
CO2: 28 mmol/L (ref 22–32)
CREATININE: 1.1 mg/dL (ref 0.61–1.24)
Calcium: 8.8 mg/dL — ABNORMAL LOW (ref 8.9–10.3)
Chloride: 106 mmol/L (ref 101–111)
GFR calc Af Amer: >60 mL/min (ref >60)
GLUCOSE: 118 mg/dL — AB (ref 65–99)
POTASSIUM: 3.3 mmol/L — AB (ref 3.5–5.1)
Sodium: 142 mmol/L (ref 135–145)

## 2016-10-29 LAB — TROPONIN I: Troponin I: <0.03 ng/mL (ref <0.03)

## 2016-10-29 MED ORDER — ASPIRIN 81 MG PO CHEW
324.0000 mg | CHEWABLE_TABLET | Freq: Once | ORAL | Status: DC
  Filled 2016-10-29: qty 4

## 2016-10-29 MED ORDER — MORPHINE SULFATE (PF) 4 MG/ML IV SOLN
4.0000 mg | Freq: Once | INTRAVENOUS | Status: AC
Start: 2016-10-29 — End: 2016-10-29
  Administered 2016-10-29: 4 mg via INTRAVENOUS
  Filled 2016-10-29: qty 1

## 2016-10-29 MED ORDER — LISINOPRIL-HYDROCHLOROTHIAZIDE 20-25 MG PO TABS: 1.0000 | ORAL_TABLET | Freq: Every day | ORAL | 0 refills | Status: AC

## 2016-10-29 MED ORDER — METOCLOPRAMIDE HCL 5 MG/ML IJ SOLN
10.0000 mg | Freq: Once | INTRAMUSCULAR | Status: AC
  Administered 2016-10-29: 10 mg via INTRAVENOUS
  Filled 2016-10-29: qty 2

## 2016-10-29 MED ORDER — SODIUM CHLORIDE 0.9 % IV BOLUS (SEPSIS)
1000.0000 mL | Freq: Once | INTRAVENOUS | Status: AC
  Administered 2016-10-29: 1000 mL via INTRAVENOUS

## 2016-10-29 MED ORDER — DOXAZOSIN MESYLATE 2 MG PO TABS: 2.0000 mg | ORAL_TABLET | Freq: Every day | ORAL | 0 refills | Status: AC

## 2016-10-29 MED ORDER — POTASSIUM CHLORIDE CRYS ER 10 MEQ PO TBCR: 10.0000 meq | EXTENDED_RELEASE_TABLET | Freq: Two times a day (BID) | ORAL | 0 refills | Status: AC

## 2016-10-29 MED ORDER — KETOROLAC TROMETHAMINE 30 MG/ML IJ SOLN
30.0000 mg | Freq: Once | INTRAMUSCULAR | Status: AC
  Administered 2016-10-29: 30 mg via INTRAVENOUS
  Filled 2016-10-29: qty 1

## 2016-10-29 MED ORDER — CARVEDILOL 6.25 MG PO TABS: 6.2500 mg | ORAL_TABLET | Freq: Two times a day (BID) | ORAL | 0 refills | Status: AC

## 2016-10-29 MED ORDER — DIPHENHYDRAMINE HCL 50 MG/ML IJ SOLN
50.0000 mg | Freq: Once | INTRAMUSCULAR | Status: AC
  Administered 2016-10-29: 50 mg via INTRAVENOUS
  Filled 2016-10-29: qty 1

## 2016-10-29 NOTE — ED Provider Notes (Signed)
[facility] Emergency Department Provider Note  Time seen: 10:21 AM  I have reviewed the triage vital signs and the nursing notes.   HISTORY  Chief Complaint No chief complaint on file.    HPI Sean Trujillo is a 51 y.o. male with a past medical history of hypertension, family history of cardiac disease, who presents to the emergency department with chest pain. According to the patient at 8:30 AM he developed chest discomfort which he describes as a vice pressing on his chest. Denies shortness of breath or nausea. Denies diaphoresis. Denies leg pain or swelling. Denies any history of chest pain in the past. Patient states the chest discomfort is moderate currently.  Past Medical History:  Diagnosis Date  . Heart problem   . Hypertension     Patient Active Problem List   Diagnosis Date Noted  . Spinal stenosis, lumbar region, with neurogenic claudication 02/23/2015  . Lumbar facet arthropathy 02/20/2015  . Sacroiliac joint dysfunction 02/12/2015  . DDD (degenerative disc disease), lumbar 01/28/2015  . DJD (degenerative joint disease) of knee 01/28/2015  . Facet syndrome, lumbar 01/28/2015    Past Surgical History:  Procedure Laterality Date  . KNEE ARTHROSCOPY Left     Prior to Admission medications   Medication Sig Start Date End Date Taking? Authorizing Provider  carvedilol (COREG) 6.25 MG tablet Take 6.25 mg by mouth 2 (two) times daily with a meal.    Historical Provider, MD  ciprofloxacin (CIPRO) 250 MG tablet Take 1 tablet (250 mg total) by mouth 2 (two) times daily. 02/23/15   Ewing Schlein, MD  ciprofloxacin (CIPRO) 250 MG tablet Take 1 tablet (250 mg total) by mouth 2 (two) times daily. 04/01/15   Ewing Schlein, MD  doxazosin (CARDURA) 2 MG tablet Take 2 mg by mouth at bedtime.    Historical Provider, MD  finasteride (PROSCAR) 5 MG tablet Take 5 mg by mouth daily.    Historical Provider, MD  HYDROcodone-acetaminophen (NORCO/VICODIN) 5-325  MG per tablet Take 1 tablet by mouth. Bid - tid if tolerated    Historical Provider, MD  lisinopril-hydrochlorothiazide (PRINZIDE,ZESTORETIC) 20-25 MG per tablet Take 1 tablet by mouth daily.    Historical Provider, MD  potassium chloride (K-DUR,KLOR-CON) 10 MEQ tablet Take 10 mEq by mouth 2 (two) times daily.    Historical Provider, MD  tiZANidine (ZANAFLEX) 4 MG capsule Limit 1/2-1 tab by mouth at bedtime as directed and if tolerated 03/17/15   Ewing Schlein, MD  traMADol Janean Sark) 50 MG tablet Limit 3-6 tabs by mouth per day if tolerated 03/17/15   Ewing Schlein, MD    Allergies  Allergen Reactions  . No Known Allergies     Family History  Problem Relation Age of Onset  . Arthritis Mother   . Depression Mother   . Diabetes Mother   . Hypertension Mother   . Mental illness Mother   . Stroke Mother   . Vision loss Mother   . Arthritis Father   . COPD Father   . Heart disease Father   . Hyperlipidemia Father   . Hypertension Father   . Kidney disease Father   . Asthma Sister   . Cancer Maternal Grandmother     Social History Social History  Substance Use Topics  . Smoking status: Never Smoker  . Smokeless tobacco: Not on file  . Alcohol use No    Review of Systems Constitutional: Negative for fever. Cardiovascular: Positive for chest pressure. Respiratory: Negative for shortness of breath.  Gastrointestinal: Negative for abdominal pain Musculoskeletal: Negative for leg pain. Neurological: Moderate headache 10-point ROS otherwise negative.  ____________________________________________   PHYSICAL EXAM:  Constitutional: Alert and oriented. Well appearing and in no distress. Eyes: Normal exam ENT   Head: Normocephalic and atraumatic.   Mouth/Throat: Mucous membranes are moist. Cardiovascular: Normal rate, regular rhythm.  Respiratory: Normal respiratory effort without tachypnea nor retractions. Breath sounds are clear  Gastrointestinal: Soft and nontender. No  distention.  Obese. Musculoskeletal: Nontender with normal range of motion in all extremities.  Neurologic:  Normal speech and language. No gross focal neurologic deficits Skin:  Skin is warm, dry and intact.  Psychiatric: Mood and affect are normal.   ____________________________________________    EKG  EKG reviewed and interpreted by myself shows normal sinus rhythm at 96 bpm, widened QRS, normal axis, RSR pattern consistent with right bundle-branch block fairly diffuse ST changes but no concerning ST changes noted.  ____________________________________________    RADIOLOGY  X-ray negative  ____________________________________________   INITIAL IMPRESSION / ASSESSMENT AND PLAN / ED COURSE  Pertinent labs & imaging results that were available during my care of the patient were reviewed by me and considered in my medical decision making (see chart for details).  Patient presents with concerning chest pain which she describes as a pressure sensation to the center of his chest since 8:30 this morning. Patient's story is concerning for possible ACS. We will cover with aspirin while awaiting lab results. No STEMI on EKG.  Patient continues to state moderate headache, states the chest pain has been relieved considerably. The wife is now here who states the patient always complains of chest pain when he has a headache which she believes is due to anxiety. I discussed given the patient's risk factors admission to the hospital for continued cardiac workup. At this time they do not wish to be admitted to the hospital if at all possible. I also discussed obtaining a second troponin 3 hours after the initial troponin, if it remains negative we could discharge home with cardiology follow-up for a stress test. The patient wishes for the second option.  Patient's repeat troponin is negative. States his chest discomfort is gone, headache is much improved. Patient wishes to go home. His blood  pressure remains elevated. Patient states he is out of some of his medications and normal systolic of everything else. He states he has appointment with his primary care doctor but not until March 2. We will prescribe her 30 day course of medications for the patient until he can see his PCP.  ____________________________________________   FINAL CLINICAL IMPRESSION(S) / ED DIAGNOSES  Chest pain Hypertension   Minna AntisKevin Yailyn Strack, MD 10/29/16 1452

## 2016-10-29 NOTE — ED Triage Notes (Signed)
Patient arrives from work. 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 Non radiating in nature.

## 2016-10-29 NOTE — Discharge Instructions (Signed)
You have been seen in the emergency department today for chest pain. Your workup has shown normal results. As we discussed please follow-up with your primary care physician as soon as possible. Return to the emergency department for any further chest pain, trouble breathing, or any other symptom personally concerning to yourself.

## 2016-10-29 NOTE — ED Notes (Signed)
MD Aware of patient vitals at D/C. OK to d/c per paduchowski. Patient educated on hypertension and importance of restarting home meds

## 2019-10-04 ENCOUNTER — Encounter: Payer: Self-pay | Admitting: Emergency Medicine

## 2019-10-04 ENCOUNTER — Emergency Department
Admission: EM | Admit: 2019-10-04 | Discharge: 2019-10-04 | Disposition: A | Discharge status: Home / Self Care | Payer: BC Managed Care – PPO | Attending: Emergency Medicine | Admitting: Emergency Medicine

## 2019-10-04 ENCOUNTER — Emergency Department: Payer: BC Managed Care – PPO

## 2019-10-04 ENCOUNTER — Other Ambulatory Visit: Payer: Self-pay

## 2019-10-04 DIAGNOSIS — Z79899 Other long term (current) drug therapy: Secondary | ICD-10-CM | POA: Diagnosis not present

## 2019-10-04 DIAGNOSIS — I10 Essential (primary) hypertension: Secondary | ICD-10-CM | POA: Insufficient documentation

## 2019-10-04 DIAGNOSIS — R079 Chest pain, unspecified: Secondary | ICD-10-CM

## 2019-10-04 LAB — COMPREHENSIVE METABOLIC PANEL
ALT: 19 U/L (ref 0–44)
AST: 28 U/L (ref 15–41)
Albumin: 3.6 g/dL (ref 3.5–5.0)
Alkaline Phosphatase: 51 U/L (ref 38–126)
Anion gap: 7 (ref 5–15)
BUN: 16 mg/dL (ref 6–20)
CO2: 28 mmol/L (ref 22–32)
Calcium: 8.9 mg/dL (ref 8.9–10.3)
Chloride: 107 mmol/L (ref 98–111)
Creatinine, Ser: 1.07 mg/dL (ref 0.61–1.24)
GFR calc Af Amer: >60 mL/min (ref >60)
GFR calc non Af Amer: >60 mL/min (ref >60)
Glucose, Bld: 92 mg/dL (ref 70–99)
Potassium: 3.4 mmol/L — ABNORMAL LOW (ref 3.5–5.1)
Sodium: 142 mmol/L (ref 135–145)
Total Bilirubin: 0.9 mg/dL (ref 0.3–1.2)
Total Protein: 7 g/dL (ref 6.5–8.1)

## 2019-10-04 LAB — CBC WITH DIFFERENTIAL/PLATELET
Abs Immature Granulocytes: 0.02 10*3/uL (ref 0.00–0.07)
Basophils Absolute: 0 10*3/uL (ref 0.0–0.1)
Basophils Relative: 1 %
Eosinophils Absolute: 0.3 10*3/uL (ref 0.0–0.5)
Eosinophils Relative: 3 %
HCT: 44.2 % (ref 39.0–52.0)
Hemoglobin: 14.8 g/dL (ref 13.0–17.0)
Immature Granulocytes: 0 %
Lymphocytes Relative: 17 %
Lymphs Abs: 1.2 10*3/uL (ref 0.7–4.0)
MCH: 29.2 pg (ref 26.0–34.0)
MCHC: 33.5 g/dL (ref 30.0–36.0)
MCV: 87.2 fL (ref 80.0–100.0)
Monocytes Absolute: 0.7 10*3/uL (ref 0.1–1.0)
Monocytes Relative: 10 %
Neutro Abs: 5.3 10*3/uL (ref 1.7–7.7)
Neutrophils Relative %: 69 %
Platelets: 219 10*3/uL (ref 150–400)
RBC: 5.07 MIL/uL (ref 4.22–5.81)
RDW: 13.8 % (ref 11.5–15.5)
WBC: 7.5 10*3/uL (ref 4.0–10.5)
nRBC: 0 % (ref 0.0–0.2)

## 2019-10-04 LAB — TROPONIN I (HIGH SENSITIVITY)
Troponin I (High Sensitivity): 14 ng/L (ref <18)
Troponin I (High Sensitivity): 15 ng/L (ref <18)

## 2019-10-04 NOTE — ED Provider Notes (Signed)
[facility] Emergency Department Provider Note  Time seen: 9:44 AM  I have reviewed the triage vital signs and the nursing notes.   HISTORY  Chief Complaint Chest Pain   HPI Sean Trujillo is a 54 y.o. male with a past medical history of hypertension, presents to the emergency department for chest pain.  According to the patient around 4 AM this morning he developed chest pain  while at work.  Patient took aspirin.  States the pain is since gone away, denies any chest pain currently.  Denies any shortness of breath.  Patient had Covid in September but denies any cough congestion or pleuritic pain.  Patient states when the pain did occur it was somewhat sharp in the center of his chest.  Patient states this is the second or third time he has felt a similar pain.  States he had a stress test many years ago but has not had one recently.  Past Medical History:  Diagnosis Date  . Heart problem   . Hypertension     Patient Active Problem List   Diagnosis Date Noted  . Spinal stenosis, lumbar region, with neurogenic claudication 02/23/2015  . Lumbar facet arthropathy 02/20/2015  . Sacroiliac joint dysfunction 02/12/2015  . DDD (degenerative disc disease), lumbar 01/28/2015  . DJD (degenerative joint disease) of knee 01/28/2015  . Facet syndrome, lumbar 01/28/2015    Past Surgical History:  Procedure Laterality Date  . KNEE ARTHROSCOPY Left     Prior to Admission medications   Medication Sig Start Date End Date Taking? Authorizing Provider  carvedilol (COREG) 6.25 MG tablet Take 1 tablet (6.25 mg total) by mouth 2 (two) times daily with a meal. 10/29/16   Minna Antis, MD  doxazosin (CARDURA) 2 MG tablet Take 1 tablet (2 mg total) by mouth at bedtime. 10/29/16   Minna Antis, MD  lisinopril-hydrochlorothiazide (PRINZIDE,ZESTORETIC) 20-25 MG tablet Take 1 tablet by mouth daily. 10/29/16   Minna Antis, MD  potassium chloride  (K-DUR,KLOR-CON) 10 MEQ tablet Take 1 tablet (10 mEq total) by mouth 2 (two) times daily. 10/29/16   Minna Antis, MD  sertraline (ZOLOFT) 25 MG tablet Take 25 mg by mouth daily.    [provider]  tiZANidine (ZANAFLEX) 4 MG capsule Limit 1/2-1 tab by mouth at bedtime as directed and if tolerated 03/17/15   Ewing Schlein, MD  traMADol Janean Sark) 50 MG tablet Limit 3-6 tabs by mouth per day if tolerated Patient not taking: Reported on 10/29/2016 03/17/15   Ewing Schlein, MD    Allergies  Allergen Reactions  . No Known Allergies     Family History  Problem Relation Age of Onset  . Arthritis Mother   . Depression Mother   . Diabetes Mother   . Hypertension Mother   . Mental illness Mother   . Stroke Mother   . Vision loss Mother   . Arthritis Father   . COPD Father   . Heart disease Father   . Hyperlipidemia Father   . Hypertension Father   . Kidney disease Father   . Asthma Sister   . Cancer Maternal Grandmother     Social History Social History   Tobacco Use  . Smoking status: Never Smoker  . Smokeless tobacco: Never Used  Substance Use Topics  . Alcohol use: No  . Drug use: Not on file    Review of Systems Constitutional: Negative for fever. Cardiovascular: Positive for chest pain now resolved Respiratory: Negative for shortness of breath.  Negative for cough.  Negative for pleuritic pain. Gastrointestinal: Negative for abdominal pain Musculoskeletal: Negative for musculoskeletal complaints Neurological: Negative for headache All other ROS negative  ____________________________________________   PHYSICAL EXAM:  VITAL SIGNS: ED Triage Vitals  Enc Vitals Group     BP 10/04/19 0601 (!) 169/89     Pulse Rate 10/04/19 0601 85     Resp 10/04/19 0601 20     Temp 10/04/19 0601 97.9 F (36.6 C)     Temp Source 10/04/19 0601 Oral     SpO2 10/04/19 0601 97 %     Weight 10/04/19 0600 (!) 380 lb (172.4 kg)     Height 10/04/19 0600 6\' 1"  (1.854 m)      Head Circumference --      Peak Flow --      Pain Score 10/04/19 0600 4     Pain Loc --      Pain Edu? --      Excl. in [place]? --    Constitutional: Alert and oriented. Well appearing and in no distress. Eyes: Normal exam ENT      Head: Normocephalic and atraumatic.      Mouth/Throat: Mucous membranes are moist. Cardiovascular: Normal rate, regular rhythm.  Respiratory: Normal respiratory effort without tachypnea nor retractions. Breath sounds are clear  Gastrointestinal: Soft and nontender. No distention.  Obese. Musculoskeletal: Nontender with normal range of motion in all extremities.  Neurologic:  Normal speech and language. No gross focal neurologic deficits  Skin:  Skin is warm, dry and intact.  Psychiatric: Mood and affect are normal.   ____________________________________________    EKG  EKG viewed and interpreted by myself shows a normal sinus rhythm 80 bpm with a slightly widened QRS, normal axis, largely normal intervals nonspecific but no concerning ST changes.  ____________________________________________    RADIOLOGY  Chest x-ray shows interstitial changes but no pneumonia.  ____________________________________________   INITIAL IMPRESSION / ASSESSMENT AND PLAN / ED COURSE  Pertinent labs & imaging results that were available during my care of the patient were reviewed by me and considered in my medical decision making (see chart for details).   Patient presents emergency department for sudden onset of chest pain which has since resolved.  Currently patient appears well, no distress, clear lung sounds.  Recent CT at [facility] reviewed by myself showing no concerning findings.  Vital signs are reassuring besides hypertension.  Patient's work-up so far is reassuring including chest x-ray EKG and troponin.  We will repeat a troponin.  If the patient's repeat troponin remains normal I would anticipate discharge home with cardiology follow-up for a stress test.  I discussed  this with the patient who is agreeable with this plan of care.  Patient's repeat troponin remains negative.  Patient continues to appear well.  We will discharge patient home with cardiology follow-up.   Sean Trujillo was evaluated in Emergency Department on 10/04/2019 for the symptoms described in the history of present illness. He was evaluated in the context of the global COVID-19 pandemic, which necessitated consideration that the patient might be at risk for infection with the SARS-CoV-2 virus that causes COVID-19. Institutional protocols and algorithms that pertain to the evaluation of patients at risk for COVID-19 are in a state of rapid change based on information released by regulatory bodies including the CDC and federal and state organizations. These policies and algorithms were followed during the patient's care in the ED.   ____________________________________________   FINAL CLINICAL IMPRESSION(S) / ED DIAGNOSES  Chest pain   Minna Antis, MD 10/04/19 [phone]

## 2019-10-04 NOTE — ED Triage Notes (Signed)
Pt to triage via w/c, mask in place with no distress noted; EMS brought pt in from work for sudden onset mid CP, nonradiating with no accomp symptoms

## 2019-10-04 NOTE — Discharge Instructions (Addendum)
As we discussed please call the number provided for cardiology to arrange a follow-up appointment for a stress test.  Return to the emergency department for any return of chest pain or if you develop shortness of breath, or any other symptom personally concerning to yourself.  Please also follow-up with your primary care doctor to discuss today's ER visit.

## 2019-10-04 NOTE — ED Notes (Signed)
ED Provider at bedside. 

## 2019-10-24 ENCOUNTER — Other Ambulatory Visit: Payer: Self-pay | Admitting: Internal Medicine

## 2019-11-04 ENCOUNTER — Other Ambulatory Visit: Payer: Self-pay | Admitting: Internal Medicine

## 2019-11-04 DIAGNOSIS — R0602 Shortness of breath: Secondary | ICD-10-CM

## 2019-11-04 DIAGNOSIS — I208 Other forms of angina pectoris: Secondary | ICD-10-CM

## 2019-11-08 ENCOUNTER — Other Ambulatory Visit: Payer: Self-pay | Admitting: Internal Medicine

## 2019-11-08 DIAGNOSIS — R0602 Shortness of breath: Secondary | ICD-10-CM

## 2019-11-08 DIAGNOSIS — I208 Other forms of angina pectoris: Secondary | ICD-10-CM

## 2019-11-27 ENCOUNTER — Ambulatory Visit
Admission: RE | Admit: 2019-11-27 | Discharge: 2019-11-27 | Disposition: A | Discharge status: Home / Self Care | Payer: BC Managed Care – PPO | Source: Ambulatory Visit | Attending: Internal Medicine | Admitting: Internal Medicine

## 2019-11-27 ENCOUNTER — Other Ambulatory Visit: Payer: Self-pay

## 2019-11-27 DIAGNOSIS — R0602 Shortness of breath: Secondary | ICD-10-CM

## 2019-11-27 DIAGNOSIS — I10 Essential (primary) hypertension: Secondary | ICD-10-CM | POA: Diagnosis not present

## 2019-11-27 DIAGNOSIS — I208 Other forms of angina pectoris: Secondary | ICD-10-CM | POA: Insufficient documentation

## 2019-11-27 DIAGNOSIS — I2089 Other forms of angina pectoris: Secondary | ICD-10-CM

## 2019-11-27 MED ORDER — REGADENOSON 0.4 MG/5ML IV SOLN
0.4000 mg | Freq: Once | INTRAVENOUS | Status: AC
  Administered 2019-11-27: 0.4 mg via INTRAVENOUS

## 2019-11-27 MED ORDER — TECHNETIUM TC 99M TETROFOSMIN IV KIT
32.3300 | PACK | Freq: Once | INTRAVENOUS | Status: AC | PRN
  Administered 2019-11-27: 10:00:00 32.33 via INTRAVENOUS

## 2019-11-27 NOTE — Progress Notes (Signed)
*  PRELIMINARY RESULTS* Echocardiogram 2D Echocardiogram has been performed.  Sean Trujillo 11/27/2019, 10:47 AM

## 2019-11-28 ENCOUNTER — Ambulatory Visit
Admission: RE | Admit: 2019-11-28 | Discharge: 2019-11-28 | Disposition: A | Discharge status: Home / Self Care | Payer: BC Managed Care – PPO | Source: Ambulatory Visit | Attending: Internal Medicine | Admitting: Internal Medicine

## 2019-11-28 MED ORDER — TECHNETIUM TC 99M TETROFOSMIN IV KIT
31.5700 | PACK | Freq: Once | INTRAVENOUS | Status: AC | PRN
  Administered 2019-11-28: 31.57 via INTRAVENOUS

## 2019-12-09 LAB — NM MYOCAR MULTI W/SPECT W/WALL MOTION / EF
Estimated workload: 1 METS
Exercise duration (min): 1 min
Exercise duration (sec): 2 s
LV dias vol: 219 mL (ref 62–150)
LV sys vol: 82 mL
Peak HR: 93 {beats}/min
Percent HR: 55 %
Rest HR: 75 {beats}/min
SDS: 2
SRS: 1
SSS: 6
TID: 0.91

## 2019-12-14 ENCOUNTER — Ambulatory Visit: Payer: BC Managed Care – PPO | Attending: Internal Medicine

## 2019-12-14 DIAGNOSIS — Z23 Encounter for immunization: Secondary | ICD-10-CM

## 2019-12-14 NOTE — Progress Notes (Signed)
   Covid-19 Vaccination Clinic  Name:  Angell Honse    MRN: [id number] DOB: [birth year]  12/14/2019  Mr. Arterburn was observed post Covid-19 immunization for 30 minutes based on pre-vaccination screening without incident. He was provided with Vaccine Information Sheet and instruction to access the V-Safe system.   Mr. Allemand was instructed to call 911 with any severe reactions post vaccine: Marland Kitchen Difficulty breathing  . Swelling of face and throat  . A fast heartbeat  . A bad rash all over body  . Dizziness and weakness   Immunizations Administered    Name Date Dose VIS Date Route   Pfizer COVID-19 Vaccine 12/14/2019  4:11 PM 0.3 mL 08/30/2019 Intramuscular   Manufacturer: [employer], [employer]   Lot: [id number]   NDC: [address]-8

## 2020-01-04 ENCOUNTER — Ambulatory Visit: Payer: BC Managed Care – PPO | Attending: Internal Medicine

## 2020-01-04 DIAGNOSIS — Z23 Encounter for immunization: Secondary | ICD-10-CM

## 2020-01-04 NOTE — Progress Notes (Signed)
   Covid-19 Vaccination Clinic  Name:  Kaelin Bonelli    MRN: [id number] DOB: 03/09/1966  01/04/2020  Mr. Longfield was observed post Covid-19 immunization for 30 minutes based on pre-vaccination screening without incident. He was provided with Vaccine Information Sheet and instruction to access the V-Safe system.   Mr. Doering was instructed to call 911 with any severe reactions post vaccine: Marland Kitchen Difficulty breathing  . Swelling of face and throat  . A fast heartbeat  . A bad rash all over body  . Dizziness and weakness   Immunizations Administered    Name Date Dose VIS Date Route   Pfizer COVID-19 Vaccine 01/04/2020  3:55 PM 0.3 mL 08/30/2019 Intramuscular   Manufacturer: [employer], [employer]   Lot: [id number]   NDC: [address]-9

## 2020-07-29 ENCOUNTER — Other Ambulatory Visit: Payer: Self-pay | Admitting: Specialist

## 2020-07-29 ENCOUNTER — Other Ambulatory Visit (HOSPITAL_COMMUNITY): Payer: Self-pay | Admitting: Specialist

## 2020-07-29 DIAGNOSIS — R0600 Dyspnea, unspecified: Secondary | ICD-10-CM

## 2020-07-29 DIAGNOSIS — R918 Other nonspecific abnormal finding of lung field: Secondary | ICD-10-CM

## 2020-07-29 DIAGNOSIS — R0609 Other forms of dyspnea: Secondary | ICD-10-CM

## 2020-08-03 ENCOUNTER — Other Ambulatory Visit: Payer: Self-pay | Admitting: Specialist

## 2020-08-03 DIAGNOSIS — R918 Other nonspecific abnormal finding of lung field: Secondary | ICD-10-CM

## 2020-08-04 ENCOUNTER — Other Ambulatory Visit: Payer: Self-pay | Admitting: Specialist

## 2020-08-04 DIAGNOSIS — R918 Other nonspecific abnormal finding of lung field: Secondary | ICD-10-CM

## 2020-08-27 ENCOUNTER — Ambulatory Visit: Payer: BC Managed Care – PPO

## 2021-11-28 IMAGING — CR DG CHEST 2V
2 series · 2 of 2 positions shown · non-contrast
Comparison: 10/29/2016

CLINICAL DATA: Chest pain

EXAM:
CHEST - 2 VIEW

[chest pa]
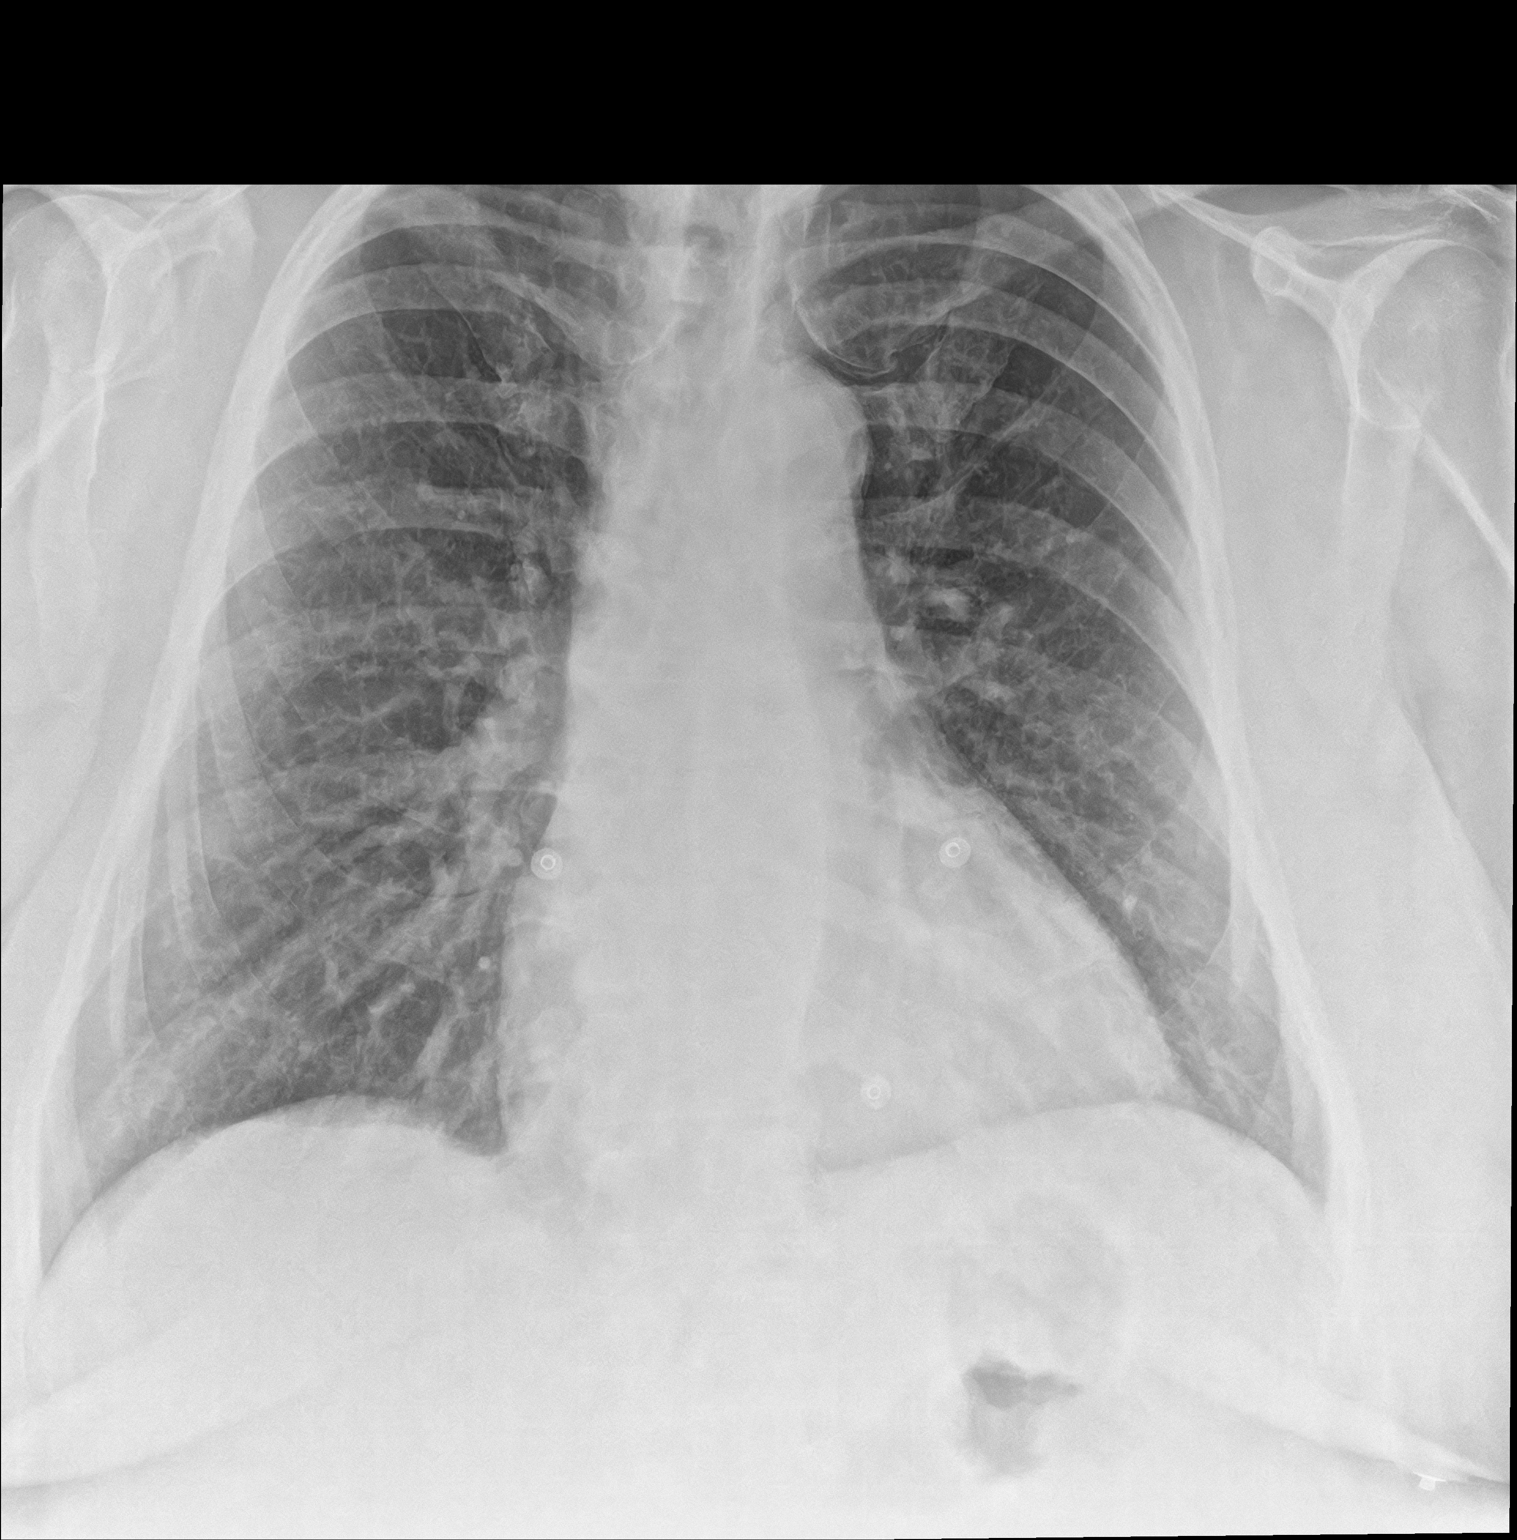

[chest lat]
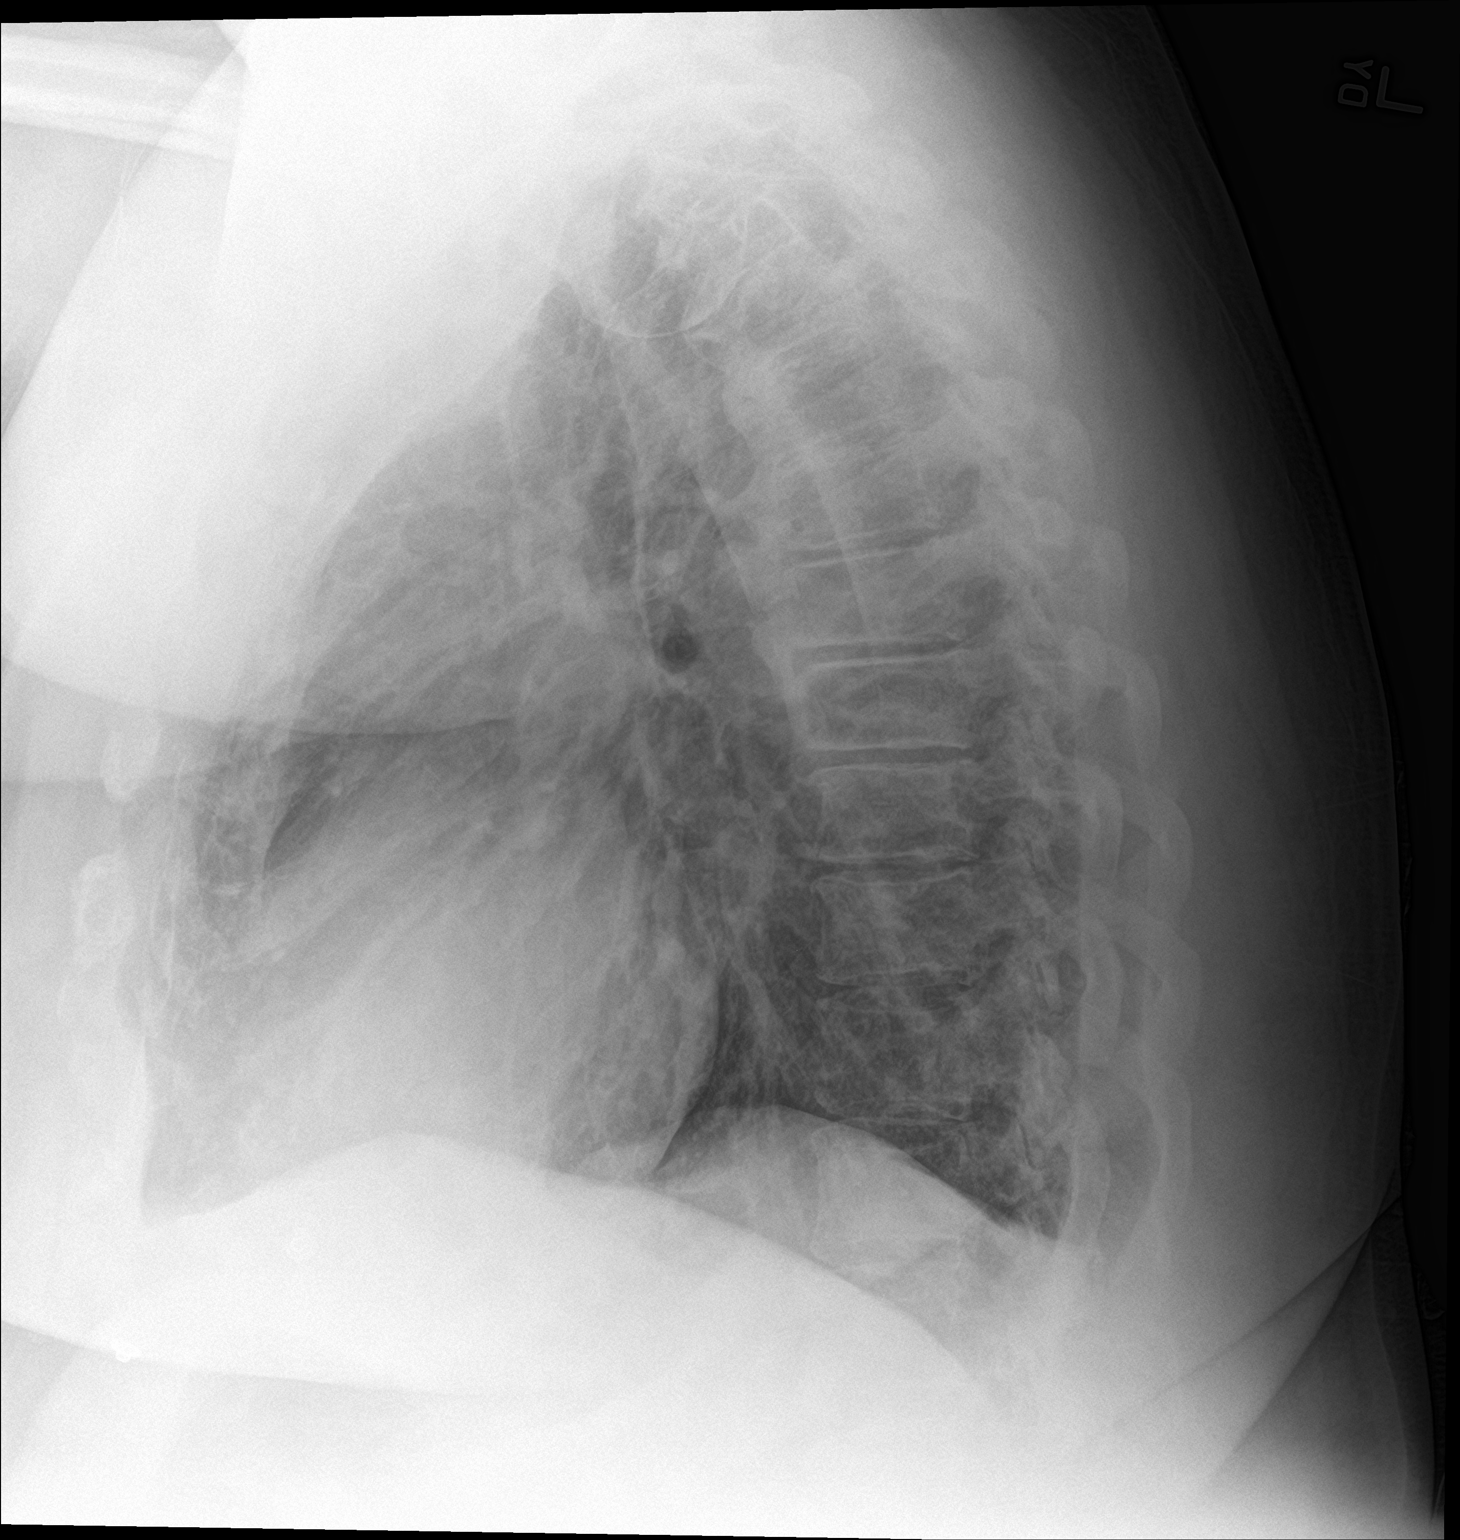

[2 of 2 positions shown; findings below may reference images not displayed]

FINDINGS: Large volume lungs with diffuse interstitial prominence. Borderline
heart size. There is no edema, consolidation, effusion, or
pneumothorax. Degenerative endplate spurring
IMPRESSION: Interstitial prominence which could be congestive or bronchitic. No
focal pneumonia.

## 2022-05-16 ENCOUNTER — Other Ambulatory Visit: Payer: Self-pay | Admitting: Student

## 2022-05-16 DIAGNOSIS — I5032 Chronic diastolic (congestive) heart failure: Secondary | ICD-10-CM

## 2022-05-26 ENCOUNTER — Ambulatory Visit: Admission: RE | Admit: 2022-05-26 | Payer: BC Managed Care – PPO | Source: Ambulatory Visit

## 2022-11-10 ENCOUNTER — Other Ambulatory Visit (INDEPENDENT_AMBULATORY_CARE_PROVIDER_SITE_OTHER): Payer: Self-pay | Admitting: Nurse Practitioner

## 2022-11-10 DIAGNOSIS — R209 Unspecified disturbances of skin sensation: Secondary | ICD-10-CM

## 2022-11-10 DIAGNOSIS — I739 Peripheral vascular disease, unspecified: Secondary | ICD-10-CM

## 2022-11-10 DIAGNOSIS — I999 Unspecified disorder of circulatory system: Secondary | ICD-10-CM

## 2022-11-14 ENCOUNTER — Ambulatory Visit (INDEPENDENT_AMBULATORY_CARE_PROVIDER_SITE_OTHER): Payer: Self-pay

## 2022-11-14 ENCOUNTER — Encounter (INDEPENDENT_AMBULATORY_CARE_PROVIDER_SITE_OTHER): Payer: Self-pay | Admitting: Nurse Practitioner

## 2022-11-14 ENCOUNTER — Ambulatory Visit (INDEPENDENT_AMBULATORY_CARE_PROVIDER_SITE_OTHER): Payer: Medicare (Managed Care) | Admitting: Nurse Practitioner

## 2022-11-14 VITALS — BP 165/92 | HR 79 | Resp 16 | Wt >= 6400 oz

## 2022-11-14 DIAGNOSIS — S90425A Blister (nonthermal), left lesser toe(s), initial encounter: Secondary | ICD-10-CM

## 2022-11-14 DIAGNOSIS — M48062 Spinal stenosis, lumbar region with neurogenic claudication: Secondary | ICD-10-CM

## 2022-11-14 DIAGNOSIS — I999 Unspecified disorder of circulatory system: Secondary | ICD-10-CM

## 2022-11-14 DIAGNOSIS — I739 Peripheral vascular disease, unspecified: Secondary | ICD-10-CM | POA: Diagnosis not present

## 2022-11-14 DIAGNOSIS — R209 Unspecified disturbances of skin sensation: Secondary | ICD-10-CM

## 2022-11-15 ENCOUNTER — Encounter (INDEPENDENT_AMBULATORY_CARE_PROVIDER_SITE_OTHER): Payer: Self-pay | Admitting: Nurse Practitioner

## 2022-11-15 NOTE — Progress Notes (Signed)
Subjective:    Patient ID: Sean Trujillo, male    DOB: [birth year], 57 y.o.   MRN: [id number] Chief Complaint  Patient presents with   New Patient (Initial Visit)    Ref [facility] consult PAD, bilateral toes cold and right toe with loss of circulation    Sean Trujillo is a 57 year old male who presents today as a referral from Dr. Hoy Morn with concern for possible peripheral arterial disease due to discoloration of the right toe.  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  However, the development of some recent wounds and cold sensation but is concerned that the patient may not have adequate circulation or perfusion to his lower extremities.  He has some small wounds on his left toes but none on his right.  He does have difficulty with ambulation due to some known spinal stenosis.  Today, the patient underwent noninvasive studies which shows right ABI of 1.11 and a left of 1.10.  The patient has a right TBI 0.62 on the left and 0.85.  He has good strong triphasic tibial artery waveforms bilaterally with normal toe waveforms on the left and slightly dampened on the right.    Review of Systems  Musculoskeletal:  Positive for back pain and gait problem.  Skin:  Positive for wound.  All other systems reviewed and are negative.      Objective:   Physical Exam Vitals reviewed.  HENT:     Head: Normocephalic.  Cardiovascular:     Rate and Rhythm: Normal rate.     Pulses:          Dorsalis pedis pulses are detected w/ Doppler on the right side and detected w/ Doppler on the left side.       Posterior tibial pulses are detected w/ Doppler on the right side and detected w/ Doppler on the left side.  Pulmonary:     Effort: Pulmonary effort is normal.  Musculoskeletal:     Right lower leg: Edema present.     Left lower leg: Edema present.  Skin:    General: Skin is warm and dry.  Neurological:     Mental Status: He is alert and oriented to  person, place, and time.  Psychiatric:        Mood and Affect: Mood normal.        Behavior: Behavior normal.        Thought Content: Thought content normal.        Judgment: Judgment normal.     BP (!) 165/92 (BP Location: Left Arm)   Pulse 79   Resp 16   Wt (!) 411 lb (186.4 kg)   BMI 54.22 kg/m   Past Medical History:  Diagnosis Date   Heart problem    Hypertension     Social History   Socioeconomic History   Marital status: Married    Spouse name: Not on file   Number of children: Not on file   Years of education: Not on file   Highest education level: Not on file  Occupational History   Not on file  Tobacco Use   Smoking status: Never   Smokeless tobacco: Never  Substance and Sexual Activity   Alcohol use: No   Drug use: Not on file   Sexual activity: Not on file  Other Topics Concern   Not on file  Social History Narrative   Not on file   Social Determinants of Health   Financial  Resource Strain: Not on file  Food Insecurity: Not on file  Transportation Needs: Not on file  Physical Activity: Not on file  Stress: Not on file  Social Connections: Not on file  Intimate Partner Violence: Not on file    Past Surgical History:  Procedure Laterality Date   KNEE ARTHROSCOPY Left     Family History  Problem Relation Age of Onset   Arthritis Mother    Depression Mother    Diabetes Mother    Hypertension Mother    Mental illness Mother    Stroke Mother    Vision loss Mother    Arthritis Father    COPD Father    Heart disease Father    Hyperlipidemia Father    Hypertension Father    Kidney disease Father    Asthma Sister    Cancer Maternal Grandmother     Allergies  Allergen Reactions   No Known Allergies        Latest Ref Rng & Units 10/04/2019    6:03 AM 10/29/2016   10:15 AM  CBC  WBC 4.0 - 10.5 K/uL 7.5  6.5   Hemoglobin 13.0 - 17.0 g/dL 14.8  14.5   Hematocrit 39.0 - 52.0 % 44.2  41.4   Platelets 150 - 400 K/uL 219  199        CMP     Component Value Date/Time   NA 142 10/04/2019 0603   K 3.4 (L) 10/04/2019 0603   CL 107 10/04/2019 0603   CO2 28 10/04/2019 0603   GLUCOSE 92 10/04/2019 0603   BUN 16 10/04/2019 0603   CREATININE 1.07 10/04/2019 0603   CALCIUM 8.9 10/04/2019 0603   PROT 7.0 10/04/2019 0603   ALBUMIN 3.6 10/04/2019 0603   AST 28 10/04/2019 0603   ALT 19 10/04/2019 0603   ALKPHOS 51 10/04/2019 0603   BILITOT 0.9 10/04/2019 0603   GFRNONAA >60 10/04/2019 0603   GFRAA >60 10/04/2019 0603     No results found.     Assessment & Plan:   1. PVD (peripheral vascular disease) ([place]) Today the patient has some very mild evidence of peripheral arterial disease but in his right lower extremity.  This is mild however.  Given the patient's vasculitis we will continue to maintain follow-up.  Patient will return in 1 year with noninvasive studies unless symptoms worsen.  2. Spinal stenosis, lumbar region, with neurogenic claudication This likely accounts for any claudication-like symptoms patient has.  She is studies are largely within normal limits   3. Toe blister without infection, left, initial encounter The patient has a few small wounds on his toes on the left 1 of which appears to be more of a blood blister.  We will have the patient evaluated by podiatry for further wound treatment. - Ambulatory referral to Podiatry    Current Outpatient Medications on File Prior to Visit  Medication Sig Dispense Refill   albuterol (VENTOLIN HFA) 108 (90 Base) MCG/ACT inhaler Inhale 2 puffs into the lungs every 4 (four) hours as needed.     amLODipine (NORVASC) 10 MG tablet Take 1 tablet by mouth daily.     buPROPion (WELLBUTRIN XL) 150 MG 24 hr tablet Take 450 mg by mouth daily.     celecoxib (CELEBREX) 100 MG capsule Take 200 mg by mouth 2 (two) times daily.     cyclobenzaprine (FLEXERIL) 10 MG tablet Take 10 mg by mouth 3 (three) times daily as needed.     DULoxetine (CYMBALTA) 20  MG capsule  Take 40 mg by mouth every morning.     fluticasone (FLOVENT HFA) 220 MCG/ACT inhaler Inhale 1 puff into the lungs 2 (two) times daily.     furosemide (LASIX) 20 MG tablet Take 20 mg by mouth daily.     lisinopril-hydrochlorothiazide (PRINZIDE,ZESTORETIC) 20-25 MG tablet Take 1 tablet by mouth daily. 30 tablet 0   losartan (COZAAR) 100 MG tablet Take 100 mg by mouth daily.     meloxicam (MOBIC) 7.5 MG tablet Take 7.5 mg by mouth 2 (two) times daily as needed.     metFORMIN (GLUCOPHAGE) 500 MG tablet Take 500 mg by mouth 2 (two) times daily.     primidone (MYSOLINE) 250 MG tablet Take 250 mg by mouth 2 (two) times daily.     rosuvastatin (CRESTOR) 40 MG tablet Take 40 mg by mouth daily.     tamsulosin (FLOMAX) 0.4 MG CAPS capsule Take 0.4 mg by mouth daily.     tirzepatide [facility]) 2.5 MG/0.5ML Pen Inject 2.5 mg into the skin once a week.     topiramate (TOPAMAX) 25 MG tablet Take 25 mg by mouth 4 (four) times daily.     carvedilol (COREG) 6.25 MG tablet Take 1 tablet (6.25 mg total) by mouth 2 (two) times daily with a meal. (Patient not taking: Reported on 11/14/2022) 60 tablet 0   doxazosin (CARDURA) 2 MG tablet Take 1 tablet (2 mg total) by mouth at bedtime. (Patient not taking: Reported on 11/14/2022) 30 tablet 0   potassium chloride (K-DUR,KLOR-CON) 10 MEQ tablet Take 1 tablet (10 mEq total) by mouth 2 (two) times daily. (Patient not taking: Reported on 11/14/2022) 30 tablet 0   sertraline (ZOLOFT) 25 MG tablet Take 25 mg by mouth daily. (Patient not taking: Reported on 11/14/2022)     tiZANidine (ZANAFLEX) 4 MG capsule Limit 1/2-1 tab by mouth at bedtime as directed and if tolerated (Patient not taking: Reported on 11/14/2022) 30 capsule 2   traMADol (ULTRAM) 50 MG tablet Limit 3-6 tabs by mouth per day if tolerated (Patient not taking: Reported on 10/29/2016) 180 tablet 0   No current facility-administered medications on file prior to visit.    There are no Patient Instructions on file for  this visit. No follow-ups on file.   Kris Hartmann, NP

## 2022-11-16 LAB — VAS US ABI WITH/WO TBI
Left ABI: 1.1
Right ABI: 1.11

## 2023-11-09 ENCOUNTER — Other Ambulatory Visit (INDEPENDENT_AMBULATORY_CARE_PROVIDER_SITE_OTHER): Payer: Self-pay | Admitting: Nurse Practitioner

## 2023-11-09 DIAGNOSIS — I739 Peripheral vascular disease, unspecified: Secondary | ICD-10-CM

## 2023-11-11 DIAGNOSIS — E785 Hyperlipidemia, unspecified: Secondary | ICD-10-CM | POA: Insufficient documentation

## 2023-11-11 DIAGNOSIS — I10 Essential (primary) hypertension: Secondary | ICD-10-CM | POA: Insufficient documentation

## 2023-11-11 DIAGNOSIS — J449 Chronic obstructive pulmonary disease, unspecified: Secondary | ICD-10-CM | POA: Insufficient documentation

## 2023-11-11 DIAGNOSIS — I739 Peripheral vascular disease, unspecified: Secondary | ICD-10-CM | POA: Insufficient documentation

## 2023-11-11 NOTE — Progress Notes (Deleted)
 MRN : [id number]  Sean Trujillo is a 58 y.o. (12/22/1965) male who presents with chief complaint of check circulation.  History of Present Illness:   The patient returns to the office for followup regarding atherosclerotic changes of the lower extremities and review of the noninvasive studies.   There have been no interval changes in lower extremity symptoms. No interval shortening of the patient's claudication distance or development of rest pain symptoms. No new ulcers or wounds have occurred since the last visit.  There have been no significant changes to the patient's overall health care.  The patient denies amaurosis fugax or recent TIA symptoms. There are no documented recent neurological changes noted. There is no history of DVT, PE or superficial thrombophlebitis. The patient denies recent episodes of angina or shortness of breath.   ABI Rt=*** and Lt=***  (previous ABI's Rt=*** and Lt=***) Duplex ultrasound of the ***  No outpatient medications have been marked as taking for the 11/13/23 encounter (Appointment) with Gilda Crease, Latina Craver, MD.    Past Medical History:  Diagnosis Date   Heart problem    Hypertension     Past Surgical History:  Procedure Laterality Date   KNEE ARTHROSCOPY Left     Social History Social History   Tobacco Use   Smoking status: Never   Smokeless tobacco: Never  Substance Use Topics   Alcohol use: No    Family History Family History  Problem Relation Age of Onset   Arthritis Mother    Depression Mother    Diabetes Mother    Hypertension Mother    Mental illness Mother    Stroke Mother    Vision loss Mother    Arthritis Father    COPD Father    Heart disease Father    Hyperlipidemia Father    Hypertension Father    Kidney disease Father    Asthma Sister    Cancer Maternal Grandmother     Allergies  Allergen Reactions   No Known Allergies       REVIEW OF SYSTEMS (Negative unless checked)  Constitutional: [] Weight loss  [] Fever  [] Chills Cardiac: [] Chest pain   [] Chest pressure   [] Palpitations   [] Shortness of breath when laying flat   [] Shortness of breath with exertion. Vascular:  [x] Pain in legs with walking   [] Pain in legs at rest  [] History of DVT   [] Phlebitis   [] Swelling in legs   [] Varicose veins   [] Non-healing ulcers Pulmonary:   [] Uses home oxygen   [] Productive cough   [] Hemoptysis   [] Wheeze  [] COPD   [] Asthma Neurologic:  [] Dizziness   [] Seizures   [] History of stroke   [] History of TIA  [] Aphasia   [] Vissual changes   [] Weakness or numbness in arm   [] Weakness or numbness in leg Musculoskeletal:   [] Joint swelling   [] Joint pain   [] Low back pain Hematologic:  [] Easy bruising  [] Easy bleeding   [] Hypercoagulable state   [] Anemic Gastrointestinal:  [] Diarrhea   [] Vomiting  [] Gastroesophageal reflux/heartburn   [] Difficulty swallowing. Genitourinary:  [] Chronic kidney disease   [] Difficult urination  [] Frequent  urination   [] Blood in urine Skin:  [] Rashes   [] Ulcers  Psychological:  [] History of anxiety   []  History of major depression.  Physical Examination  There were no vitals filed for this visit. There is no height or weight on file to calculate BMI. Gen: WD/WN, NAD Head: [place]/AT, No temporalis wasting.  Ear/Nose/Throat: Hearing grossly intact, nares w/o erythema or drainage Eyes: PER, EOMI, sclera nonicteric.  Neck: Supple, no masses.  No bruit or JVD.  Pulmonary:  Good air movement, no audible wheezing, no use of accessory muscles.  Cardiac: RRR, normal S1, S2, no Murmurs. Vascular:  mild trophic changes, no open wounds Vessel Right Left  Radial Palpable Palpable  PT Not Palpable Not Palpable  DP Not Palpable Not Palpable  Gastrointestinal: soft, non-distended. No guarding/no peritoneal signs.  Musculoskeletal: M/S 5/5 throughout.  No visible deformity.  Neurologic: CN 2-12 intact. Pain and light  touch intact in extremities.  Symmetrical.  Speech is fluent. Motor exam as listed above. Psychiatric: Judgment intact, Mood & affect appropriate for pt's clinical situation. Dermatologic: No rashes or ulcers noted.  No changes consistent with cellulitis.   CBC Lab Results  Component Value Date   WBC 7.5 10/04/2019   HGB 14.8 10/04/2019   HCT 44.2 10/04/2019   MCV 87.2 10/04/2019   PLT 219 10/04/2019    BMET    Component Value Date/Time   NA 142 10/04/2019 0603   K 3.4 (L) 10/04/2019 0603   CL 107 10/04/2019 0603   CO2 28 10/04/2019 0603   GLUCOSE 92 10/04/2019 0603   BUN 16 10/04/2019 0603   CREATININE 1.07 10/04/2019 0603   CALCIUM 8.9 10/04/2019 0603   GFRNONAA >60 10/04/2019 0603   GFRAA >60 10/04/2019 0603   CrCl cannot be calculated (Patient's most recent lab result is older than the maximum 21 days allowed.).  COAG No results found for: "INR", "PROTIME"  Radiology No results found.   Assessment/Plan There are no diagnoses linked to this encounter.   Levora Dredge, MD  11/11/2023 3:39 PM

## 2023-11-13 ENCOUNTER — Encounter (INDEPENDENT_AMBULATORY_CARE_PROVIDER_SITE_OTHER): Payer: Medicare (Managed Care)

## 2023-11-13 ENCOUNTER — Ambulatory Visit (INDEPENDENT_AMBULATORY_CARE_PROVIDER_SITE_OTHER): Payer: Medicare (Managed Care) | Admitting: Vascular Surgery

## 2023-11-13 DIAGNOSIS — M51361 Other intervertebral disc degeneration, lumbar region with lower extremity pain only: Secondary | ICD-10-CM

## 2023-11-13 DIAGNOSIS — E782 Mixed hyperlipidemia: Secondary | ICD-10-CM

## 2023-11-13 DIAGNOSIS — J449 Chronic obstructive pulmonary disease, unspecified: Secondary | ICD-10-CM

## 2023-11-13 DIAGNOSIS — I10 Essential (primary) hypertension: Secondary | ICD-10-CM

## 2023-11-13 DIAGNOSIS — I739 Peripheral vascular disease, unspecified: Secondary | ICD-10-CM

## 2024-02-06 ENCOUNTER — Encounter (INDEPENDENT_AMBULATORY_CARE_PROVIDER_SITE_OTHER): Payer: Self-pay
